# Patient Record
Sex: Male | Born: 1995 | Race: Black or African American | Hispanic: No | Marital: Single | Smoking: Never smoker
Health system: Southern US, Community
[De-identification: ages and names within clinical notes are randomized; demographics above are authoritative.]

## PROBLEM LIST (undated history)

## (undated) DIAGNOSIS — T7840XA Allergy, unspecified, initial encounter: Secondary | ICD-10-CM

## (undated) DIAGNOSIS — D573 Sickle-cell trait: Secondary | ICD-10-CM

## (undated) DIAGNOSIS — L309 Dermatitis, unspecified: Secondary | ICD-10-CM

## (undated) DIAGNOSIS — G4733 Obstructive sleep apnea (adult) (pediatric): Secondary | ICD-10-CM

## (undated) DIAGNOSIS — E669 Obesity, unspecified: Secondary | ICD-10-CM

## (undated) DIAGNOSIS — J45909 Unspecified asthma, uncomplicated: Secondary | ICD-10-CM

## (undated) HISTORY — DX: Dermatitis, unspecified: L30.9

## (undated) HISTORY — DX: Sickle-cell trait: D57.3

## (undated) HISTORY — DX: Allergy, unspecified, initial encounter: T78.40XA

## (undated) HISTORY — DX: Obesity, unspecified: E66.9

## (undated) HISTORY — PX: TONSILLECTOMY: SUR1361

## (undated) HISTORY — DX: Obstructive sleep apnea (adult) (pediatric): G47.33

## (undated) HISTORY — DX: Unspecified asthma, uncomplicated: J45.909

---
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
## 2016-01-28 ENCOUNTER — Other Ambulatory Visit: Payer: Self-pay | Admitting: Pediatrics

## 2016-05-26 ENCOUNTER — Ambulatory Visit (INDEPENDENT_AMBULATORY_CARE_PROVIDER_SITE_OTHER): Payer: 59 | Admitting: Family

## 2016-05-26 ENCOUNTER — Encounter: Payer: Self-pay | Admitting: Family

## 2016-05-26 VITALS — BP 128/84 | Ht 68.5 in | Wt 207.9 lb

## 2016-05-26 DIAGNOSIS — Z23 Encounter for immunization: Secondary | ICD-10-CM | POA: Diagnosis not present

## 2016-05-26 DIAGNOSIS — Z0000 Encounter for general adult medical examination without abnormal findings: Secondary | ICD-10-CM | POA: Diagnosis not present

## 2016-05-26 DIAGNOSIS — Z68.41 Body mass index (BMI) pediatric, greater than or equal to 95th percentile for age: Secondary | ICD-10-CM

## 2016-05-26 DIAGNOSIS — E669 Obesity, unspecified: Secondary | ICD-10-CM

## 2016-05-26 DIAGNOSIS — Z00129 Encounter for routine child health examination without abnormal findings: Secondary | ICD-10-CM

## 2016-05-26 NOTE — Progress Notes (Signed)
Subjective:     History was provided by the patient.  Craig Baker is a 20 y.o. male who is here for this well-child visit.  Immunization History  Administered Date(s) Administered  . DTaP 01/01/1998, 03/08/1998, 05/10/1998, 01/31/1999, 08/24/2002  . HPV 9-valent 08/16/2014  . HPV Quadrivalent 06/11/2011, 05/11/2013  . Hepatitis A 11/01/1999, 06/03/2000  . Hepatitis B [birth year], 1997, 01/01/1998, 08/13/1998  . HiB (PRP-OMP) 01/01/1998, 03/08/1998, 01/31/1999  . IPV 01/01/1998, 03/08/1998, 10/31/1998, 08/24/2002  . Influenza Split 11/22/2007, 09/01/2009, 08/22/2010, 09/12/2011  . Influenza,inj,quad, With Preservative 08/16/2014  . Influenza-Unspecified 08/31/2013  . MMR 10/31/1998, 09/19/2002  . Meningococcal Conjugate 06/11/2011  . Pneumococcal Conjugate-13 05/02/1999, 07/29/1999  . Rotavirus Pentavalent 01/02/1998, 03/08/1998, 05/10/1998  . Tdap 11/21/2008  . Varicella 10/31/1998, 05/11/2013   The following portions of the patient's history were reviewed and updated as appropriate: allergies, current medications, past family history, past medical history, past social history, past surgical history and problem list.  Current Issues: Current concerns include NO concerns. Ready to go to college. Currently menstruating? not applicable Sexually active? no  Does patient snore? no   Review of Nutrition: Current diet: Eats an ok diet. Has been eating out lately. Drinks mainly water and juice.  Balanced diet? yes  Social Screening:  Parental relations: Gets along with parents well  Sibling relations: only child Discipline concerns? no Concerns regarding behavior with peers? no School performance: doing well; no concerns Secondhand smoke exposure? no  Screening Questions: Risk factors for anemia: no Risk factors for vision problems: no Risk factors for hearing problems: no Risk factors for tuberculosis: no Risk factors for dyslipidemia: no Risk factors for sexually-transmitted  infections: no Risk factors for alcohol/drug use:  no    Objective:    There were no vitals filed for this visit. Growth parameters are noted and are not appropriate for age.  General:   alert and cooperative  Gait:   normal  Skin:   normal  Oral cavity:   lips, mucosa, and tongue normal; teeth and gums normal  Eyes:   sclerae white, pupils equal and reactive, red reflex normal bilaterally  Ears:   normal bilaterally  Neck:   no adenopathy, no carotid bruit, no JVD, supple, symmetrical, trachea midline and thyroid not enlarged, symmetric, no tenderness/mass/nodules  Lungs:  clear to auscultation bilaterally and normal percussion bilaterally  Heart:   regular rate and rhythm, S1, S2 normal, no murmur, click, rub or gallop  Abdomen:  soft, non-tender; bowel sounds normal; no masses,  no organomegaly  GU:  normal genitalia, normal testes and scrotum, no hernias present  Tanner Stage:   5  Extremities:   extremities normal, atraumatic, no cyanosis or edema  Neuro:  normal without focal findings, mental status, speech normal, alert and oriented x3, PERLA and reflexes normal and symmetric     Assessment:    Well adolescent.    Plan:    1. Anticipatory guidance discussed. Gave handout on well-child issues at this age. Specific topics reviewed: bicycle helmets, drugs, ETOH, and tobacco, importance of regular dental care, importance of regular exercise, importance of varied diet, limit TV, media violence, minimize junk food, puberty, safe storage of any firearms in the home, seat belts, sex; STD and pregnancy prevention and testicular self-exam.  2.  Weight management:  The patient was counseled regarding nutrition and physical activity.  3. Development: appropriate for age  59. Immunizations today: per orders. History of previous adverse reactions to immunizations? no  5. Follow-up visit in 1 year  for next well child visit, or sooner as needed.   

## 2016-05-26 NOTE — Patient Instructions (Signed)
Well Child Care - 20 Years Old SCHOOL PERFORMANCE  Your teenager should begin preparing for college or technical school. To keep your teenager on track, help him or her:   Prepare for college admissions exams and meet exam deadlines.   Fill out college or technical school applications and meet application deadlines.   Schedule time to study. Teenagers with part-time jobs may have difficulty balancing a job and schoolwork. SOCIAL AND EMOTIONAL DEVELOPMENT  Your teenager:  May seek privacy and spend less time with family.  May seem overly focused on himself or herself (self-centered).  May experience increased sadness or loneliness.  May also start worrying about his or her future.  Will want to make his or her own decisions (such as about friends, studying, or extracurricular activities).  Will likely complain if you are too involved or interfere with his or her plans.  Will develop more intimate relationships with friends. ENCOURAGING DEVELOPMENT  Encourage your teenager to:   Participate in sports or after-school activities.   Develop his or her interests.   Volunteer or join a Systems developer.  Help your teenager develop strategies to deal with and manage stress.  Encourage your teenager to participate in approximately 60 minutes of daily physical activity.   Limit television and computer time to 2 hours each day. Teenagers who watch excessive television are more likely to become overweight. Monitor television choices. Block channels that are not acceptable for viewing by teenagers. RECOMMENDED IMMUNIZATIONS  Hepatitis B vaccine. Doses of this vaccine may be obtained, if needed, to catch up on missed doses. A child or teenager aged 11-15 years can obtain a 2-dose series. The second dose in a 2-dose series should be obtained no earlier than 4 months after the first dose.  Tetanus and diphtheria toxoids and acellular pertussis (Tdap) vaccine. A child or  teenager aged 11-18 years who is not fully immunized with the diphtheria and tetanus toxoids and acellular pertussis (DTaP) or has not obtained a dose of Tdap should obtain a dose of Tdap vaccine. The dose should be obtained regardless of the length of time since the last dose of tetanus and diphtheria toxoid-containing vaccine was obtained. The Tdap dose should be followed with a tetanus diphtheria (Td) vaccine dose every 10 years. Pregnant adolescents should obtain 1 dose during each pregnancy. The dose should be obtained regardless of the length of time since the last dose was obtained. Immunization is preferred in the 27th to 36th week of gestation.  Pneumococcal conjugate (PCV13) vaccine. Teenagers who have certain conditions should obtain the vaccine as recommended.  Pneumococcal polysaccharide (PPSV23) vaccine. Teenagers who have certain high-risk conditions should obtain the vaccine as recommended.  Inactivated poliovirus vaccine. Doses of this vaccine may be obtained, if needed, to catch up on missed doses.  Influenza vaccine. A dose should be obtained every year.  Measles, mumps, and rubella (MMR) vaccine. Doses should be obtained, if needed, to catch up on missed doses.  Varicella vaccine. Doses should be obtained, if needed, to catch up on missed doses.  Hepatitis A vaccine. A teenager who has not obtained the vaccine before 19 years of age should obtain the vaccine if he or she is at risk for infection or if hepatitis A protection is desired.  Human papillomavirus (HPV) vaccine. Doses of this vaccine may be obtained, if needed, to catch up on missed doses.  Meningococcal vaccine. A booster should be obtained at age 15 years. Doses should be obtained, if needed, to catch  up on missed doses. Children and adolescents aged 11-18 years who have certain high-risk conditions should obtain 2 doses. Those doses should be obtained at least 8 weeks apart. TESTING Your teenager should be screened  for:   Vision and hearing problems.   Alcohol and drug use.   High blood pressure.  Scoliosis.  HIV. Teenagers who are at an increased risk for hepatitis B should be screened for this virus. Your teenager is considered at high risk for hepatitis B if:  You were born in a country where hepatitis B occurs often. Talk with your health care provider about which countries are considered high-risk.  Your were born in a high-risk country and your teenager has not received hepatitis B vaccine.  Your teenager has HIV or AIDS.  Your teenager uses needles to inject street drugs.  Your teenager lives with, or has sex with, someone who has hepatitis B.  Your teenager is a male and has sex with other males (MSM).  Your teenager gets hemodialysis treatment.  Your teenager takes certain medicines for conditions like cancer, organ transplantation, and autoimmune conditions. Depending upon risk factors, your teenager may also be screened for:   Anemia.   Tuberculosis.  Depression.  Cervical cancer. Most females should wait until they turn 19 years old to have their first Pap test. Some adolescent girls have medical problems that increase the chance of getting cervical cancer. In these cases, the health care provider may recommend earlier cervical cancer screening. If your child or teenager is sexually active, he or she may be screened for:  Certain sexually transmitted diseases.  Chlamydia.  Gonorrhea (females only).  Syphilis.  Pregnancy. If your child is male, her health care provider may ask:  Whether she has begun menstruating.  The start date of her last menstrual cycle.  The typical length of her menstrual cycle. Your teenager's health care provider will measure body mass index (BMI) annually to screen for obesity. Your teenager should have his or her blood pressure checked at least one time per year during a well-child checkup. The health care provider may interview  your teenager without parents present for at least part of the examination. This can insure greater honesty when the health care provider screens for sexual behavior, substance use, risky behaviors, and depression. If any of these areas are concerning, more formal diagnostic tests may be done. NUTRITION  Encourage your teenager to help with meal planning and preparation.   Model healthy food choices and limit fast food choices and eating out at restaurants.   Eat meals together as a family whenever possible. Encourage conversation at mealtime.   Discourage your teenager from skipping meals, especially breakfast.   Your teenager should:   Eat a variety of vegetables, fruits, and lean meats.   Have 3 servings of low-fat milk and dairy products daily. Adequate calcium intake is important in teenagers. If your teenager does not drink milk or consume dairy products, he or she should eat other foods that contain calcium. Alternate sources of calcium include dark and leafy greens, canned fish, and calcium-enriched juices, breads, and cereals.   Drink plenty of water. Fruit juice should be limited to 8-12 oz (240-360 mL) each day. Sugary beverages and sodas should be avoided.   Avoid foods high in fat, salt, and sugar, such as candy, chips, and cookies.  Body image and eating problems may develop at this age. Monitor your teenager closely for any signs of these issues and contact your health care  provider if you have any concerns. ORAL HEALTH Your teenager should brush his or her teeth twice a day and floss daily. Dental examinations should be scheduled twice a year.  SKIN CARE  Your teenager should protect himself or herself from sun exposure. He or she should wear weather-appropriate clothing, hats, and other coverings when outdoors. Make sure that your child or teenager wears sunscreen that protects against both UVA and UVB radiation.  Your teenager may have acne. If this is  concerning, contact your health care provider. SLEEP Your teenager should get 8.5-9.5 hours of sleep. Teenagers often stay up late and have trouble getting up in the morning. A consistent lack of sleep can cause a number of problems, including difficulty concentrating in class and staying alert while driving. To make sure your teenager gets enough sleep, he or she should:   Avoid watching television at bedtime.   Practice relaxing nighttime habits, such as reading before bedtime.   Avoid caffeine before bedtime.   Avoid exercising within 3 hours of bedtime. However, exercising earlier in the evening can help your teenager sleep well.  PARENTING TIPS Your teenager may depend more upon peers than on you for information and support. As a result, it is important to stay involved in your teenager's life and to encourage him or her to make healthy and safe decisions.   Be consistent and fair in discipline, providing clear boundaries and limits with clear consequences.  Discuss curfew with your teenager.   Make sure you know your teenager's friends and what activities they engage in.  Monitor your teenager's school progress, activities, and social life. Investigate any significant changes.  Talk to your teenager if he or she is moody, depressed, anxious, or has problems paying attention. Teenagers are at risk for developing a mental illness such as depression or anxiety. Be especially mindful of any changes that appear out of character.  Talk to your teenager about:  Body image. Teenagers may be concerned with being overweight and develop eating disorders. Monitor your teenager for weight gain or loss.  Handling conflict without physical violence.  Dating and sexuality. Your teenager should not put himself or herself in a situation that makes him or her uncomfortable. Your teenager should tell his or her partner if he or she does not want to engage in sexual activity. SAFETY    Encourage your teenager not to blast music through headphones. Suggest he or she wear earplugs at concerts or when mowing the lawn. Loud music and noises can cause hearing loss.   Teach your teenager not to swim without adult supervision and not to dive in shallow water. Enroll your teenager in swimming lessons if your teenager has not learned to swim.   Encourage your teenager to always wear a properly fitted helmet when riding a bicycle, skating, or skateboarding. Set an example by wearing helmets and proper safety equipment.   Talk to your teenager about whether he or she feels safe at school. Monitor gang activity in your neighborhood and local schools.   Encourage abstinence from sexual activity. Talk to your teenager about sex, contraception, and sexually transmitted diseases.   Discuss cell phone safety. Discuss texting, texting while driving, and sexting.   Discuss Internet safety. Remind your teenager not to disclose information to strangers over the Internet. Home environment:  Equip your home with smoke detectors and change the batteries regularly. Discuss home fire escape plans with your teen.  Do not keep handguns in the home. If there  is a handgun in the home, the gun and ammunition should be locked separately. Your teenager should not know the lock combination or where the key is kept. Recognize that teenagers may imitate violence with guns seen on television or in movies. Teenagers do not always understand the consequences of their behaviors. Tobacco, alcohol, and drugs:  Talk to your teenager about smoking, drinking, and drug use among friends or at friends' homes.   Make sure your teenager knows that tobacco, alcohol, and drugs may affect brain development and have other health consequences. Also consider discussing the use of performance-enhancing drugs and their side effects.   Encourage your teenager to call you if he or she is drinking or using drugs, or if  with friends who are.   Tell your teenager never to get in a car or boat when the driver is under the influence of alcohol or drugs. Talk to your teenager about the consequences of drunk or drug-affected driving.   Consider locking alcohol and medicines where your teenager cannot get them. Driving:  Set limits and establish rules for driving and for riding with friends.   Remind your teenager to wear a seat belt in cars and a life vest in boats at all times.   Tell your teenager never to ride in the bed or cargo area of a pickup truck.   Discourage your teenager from using all-terrain or motorized vehicles if younger than 16 years. WHAT'S NEXT? Your teenager should visit a pediatrician yearly.    This information is not intended to replace advice given to you by your health care provider. Make sure you discuss any questions you have with your health care provider.   Document Released: 01/29/2007 Document Revised: 11/24/2014 Document Reviewed: 07/19/2013 Elsevier Interactive Patient Education [employer].

## 2017-03-04 ENCOUNTER — Other Ambulatory Visit: Payer: Self-pay | Admitting: Pediatrics

## 2017-07-13 ENCOUNTER — Ambulatory Visit (INDEPENDENT_AMBULATORY_CARE_PROVIDER_SITE_OTHER): Payer: PRIVATE HEALTH INSURANCE | Admitting: Pediatrics

## 2017-07-13 VITALS — Wt 224.0 lb

## 2017-07-13 DIAGNOSIS — J4520 Mild intermittent asthma, uncomplicated: Secondary | ICD-10-CM | POA: Diagnosis not present

## 2017-07-13 DIAGNOSIS — J069 Acute upper respiratory infection, unspecified: Secondary | ICD-10-CM

## 2017-07-13 DIAGNOSIS — Z23 Encounter for immunization: Secondary | ICD-10-CM | POA: Diagnosis not present

## 2017-07-13 MED ORDER — ALBUTEROL SULFATE HFA 108 (90 BASE) MCG/ACT IN AERS: 2.0000 | INHALATION_SPRAY | Freq: Four times a day (QID) | RESPIRATORY_TRACT | 6 refills | Status: DC | PRN

## 2017-07-13 MED ORDER — ALBUTEROL SULFATE (2.5 MG/3ML) 0.083% IN NEBU: 2.5000 mg | INHALATION_SOLUTION | Freq: Four times a day (QID) | RESPIRATORY_TRACT | 0 refills | Status: DC | PRN

## 2017-07-13 NOTE — Patient Instructions (Signed)
Bronchospasm, Adult Bronchospasm is when airways in the lungs get smaller. When this happens, it can be hard to breathe. You may cough. You may also make a whistling sound when you breathe (wheeze). Follow these instructions at home: Medicines  Take over-the-counter and prescription medicines only as told by your doctor.  If you need to use an inhaler or nebulizer to take your medicine, ask your doctor how to use it.  If you were given a spacer, always use it with your inhaler. Lifestyle  Change your heating and air conditioning filter. Do this at least once a month.  Try not to use fireplaces and wood stoves.  Do not  smoke. Do not  allow smoking in your home.  Try not to use things that have a strong smell, like perfume.  Get rid of pests (such as roaches and mice) and their poop.  Remove any mold from your home.  Keep your house clean. Get rid of dust.  Use cleaning products that have no smell.  Replace carpet with wood, tile, or vinyl flooring.  Use allergy-proof pillows, mattress covers, and box spring covers.  Wash bed sheets and blankets every week. Use hot water. Dry them in a dryer.  Use blankets that are made of polyester or cotton.  Wash your hands often.  Keep pets out of your bedroom.  When you exercise, try not to breathe in cold air. General instructions  Have a plan for getting medical care. Know these things: ? When to call your doctor. ? When to call local emergency services (911 in the [place] ? Where to go in an emergency.  Stay up to date on your shots (immunizations).  When you have an episode: ? Stay calm. ? Relax. ? Breathe slowly. Contact a doctor if:  Your muscles ache.  Your chest hurts.  The color of the mucus you cough up (sputum) changes from clear or white to yellow, green, gray, or bloody.  The mucus you cough up gets thicker.  You have a fever. Get help right away if:  The whistling sound gets worse, even after you  take your medicines.  Your coughing gets worse.  You find it even harder to breathe.  Your chest hurts very much. Summary  Bronchospasm is when airways in the lungs get smaller.  When this happens, it can be hard to breathe. You may cough. You may also make a whistling sound when you breathe.  Stay away from things that cause you to have episodes. These include smoke or dust. This information is not intended to replace advice given to you by your health care provider. Make sure you discuss any questions you have with your health care provider. Document Released: 08/31/2009 Document Revised: 11/06/2016 Document Reviewed: 11/06/2016 Elsevier Interactive Patient Education  2017 Elsevier Inc.  

## 2017-07-13 NOTE — Progress Notes (Signed)
Subjective:    Craig Baker is a 21 y.o. old male here with his father for Asthma (refill albuterol) .    HPI: Craig Baker presents with history of runny nose, congestion, cough.  This started 3 days ago and did have tightness in chest.  He took albuterol and breathing was much easier.  It has been 2 years since he needed albuterol.  He usually doesn't need it but occasionally if he has a cold.  Triggers are viral illness.   He does report some tighness when he exercises in past.  Not currently having any breathing issues but does need refills.  Denies any fevers, wheezing, abd pain, lethargy.    The following portions of the patient's history were reviewed and updated as appropriate: allergies, current medications, past family history, past medical history, past social history, past surgical history and problem list.  Review of Systems Pertinent items are noted in HPI.   Allergies: Allergies  Allergen Reactions  . Fish Allergy Shortness Of Breath and Itching     Current Outpatient Prescriptions on File Prior to Visit  Medication Sig Dispense Refill  . cetirizine (ZYRTEC) 10 MG tablet take 1 tablet by mouth once daily 30 tablet 12  . fluticasone (FLONASE) 50 MCG/ACT nasal spray instill 2 sprays into each nostril once daily 16 g 12  . loratadine (CLARITIN) 10 MG tablet Take 1 tablet (10 mg total) by mouth daily. 30 tablet 12  . montelukast (SINGULAIR) 10 MG tablet take 1 tablet by mouth once daily 30 tablet 12   No current facility-administered medications on file prior to visit.     History and Problem List: Past Medical History:  Diagnosis Date  . Allergy   . Asthma    much improved.  "Rarely" has wheeze"  . Eczema   . Obesity    BMI 95-99%  . OSA (obstructive sleep apnea)   . Sickle cell trait [facility])     Patient Active Problem List   Diagnosis Date Noted  . Viral upper respiratory tract infection 07/15/2017  . Body mass index, pediatric, 85th percentile to less than 95th percentile  for age 66/30/2015  . Wheezing-associated respiratory infection (WARI) 08/04/2014  . Wheeze 08/04/2014  . 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 Elevated BP 10/26/2013  . 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 Well child check 05/11/2013  . School failure 05/11/2013  . Allergic rhinitis 10/01/2011  . OSA (obstructive sleep apnea) 10/01/2011  . Eczema   . Asthma 06/11/2011  . BMI (body mass index), pediatric, 95-99% for age 35/25/2012        Objective:    Wt 224 lb (101.6 kg)   SpO2 98%   BMI 33.56 kg/m   General: alert, active, cooperative, non toxic ENT: oropharynx moist, no lesions, nares clear discharge, nasal congestion Eye:  PERRL, EOMI, conjunctivae clear, no discharge Ears: TM clear/intact bilateral, no discharge Neck: supple, no sig LAD Lungs: clear to auscultation, no wheeze, crackles or retractions Heart: RRR, Nl S1, S2, no murmurs Abd: soft, non tender, non distended, normal BS, no organomegaly, no masses appreciated Skin: no rashes Neuro: normal mental status, No focal deficits  No results found for this or any previous visit (from the past 72 hour(s)).     Assessment:   Craig Baker is a 21 y.o. old male with  1. Viral upper respiratory tract infection   2. Mild intermittent asthma without complication   3. Need for prophylactic vaccination and inoculation against influenza  Plan:   1.  1.  Discussed suportive care for runny nose and congestion  And cough with OTC.  Monitor for retractions, tachypnea, fevers or worsening symptoms.  Consider some exercise induced asthma as he does report some tightness when he is active.  Recommend pretreating prior to exercise.  Spacer provided to use with inhaler.  Refilled albuterol.   Viral colds can last 7-10 days, smoke exposure can exacerbate and lengthen symptoms.  No steroids currently needed but return if no improvement.   --flu shot given today.   2.  Discussed to return for worsening  symptoms or further concerns.    Patient's Medications  New Prescriptions   ALBUTEROL (PROVENTIL HFA;VENTOLIN HFA) 108 (90 BASE) MCG/ACT INHALER    Inhale 2 puffs into the lungs every 6 (six) hours as needed for wheezing or shortness of breath.   ALBUTEROL (PROVENTIL) (2.5 MG/3ML) 0.083% NEBULIZER SOLUTION    Take 3 mLs (2.5 mg total) by nebulization every 6 (six) hours as needed for wheezing or shortness of breath.  Previous Medications   CETIRIZINE (ZYRTEC) 10 MG TABLET    take 1 tablet by mouth once daily   FLUTICASONE (FLONASE) 50 MCG/ACT NASAL SPRAY    instill 2 sprays into each nostril once daily   LORATADINE (CLARITIN) 10 MG TABLET    Take 1 tablet (10 mg total) by mouth daily.   MONTELUKAST (SINGULAIR) 10 MG TABLET    take 1 tablet by mouth once daily  Modified Medications   No medications on file  Discontinued Medications   ALBUTEROL (PROVENTIL HFA;VENTOLIN HFA) 108 (90 BASE) MCG/ACT INHALER    Inhale 2 puffs into the lungs every 4 (four) hours as needed for wheezing or shortness of breath (cough, shortness of breath or wheezing.).   ALBUTEROL (PROVENTIL) (2.5 MG/3ML) 0.083% NEBULIZER SOLUTION    inhale contents of 1 vial in nebulizer every 2 hours if needed for wheezing or shortness of breath     Return if symptoms worsen or fail to improve. in 2-3 days  Myles Gip, DO

## 2017-07-15 ENCOUNTER — Encounter: Payer: Self-pay | Admitting: Pediatrics

## 2017-07-15 DIAGNOSIS — J069 Acute upper respiratory infection, unspecified: Secondary | ICD-10-CM | POA: Insufficient documentation

## 2018-09-29 ENCOUNTER — Encounter (HOSPITAL_COMMUNITY): Payer: Self-pay | Admitting: Family Medicine

## 2018-09-29 ENCOUNTER — Other Ambulatory Visit: Payer: Self-pay

## 2018-09-29 ENCOUNTER — Ambulatory Visit (HOSPITAL_COMMUNITY)
Admission: EM | Admit: 2018-09-29 | Discharge: 2018-09-29 | Disposition: A | Discharge status: Home / Self Care | Payer: BC Managed Care – PPO | Attending: Family Medicine | Admitting: Family Medicine

## 2018-09-29 DIAGNOSIS — K529 Noninfective gastroenteritis and colitis, unspecified: Secondary | ICD-10-CM

## 2018-09-29 MED ORDER — ONDANSETRON 4 MG PO TBDP
4.0000 mg | ORAL_TABLET | Freq: Once | ORAL | Status: AC
  Administered 2018-09-29: 4 mg via ORAL

## 2018-09-29 MED ORDER — ONDANSETRON 4 MG PO TBDP
ORAL_TABLET | ORAL | Status: AC
  Filled 2018-09-29: qty 1

## 2018-09-29 MED ORDER — ONDANSETRON 4 MG PO TBDP: 4.0000 mg | ORAL_TABLET | Freq: Once | ORAL | Status: DC

## 2018-09-29 MED ORDER — ONDANSETRON 4 MG PO TBDP: 4.0000 mg | ORAL_TABLET | Freq: Three times a day (TID) | ORAL | 0 refills | Status: DC | PRN

## 2018-09-29 NOTE — Discharge Instructions (Addendum)

## 2018-09-29 NOTE — ED Provider Notes (Signed)
[facility]   [id number] 09/29/18 Arrival Time: 1600  ASSESSMENT & PLAN:  1. Gastroenteritis   Benign abdominal exam. Suspect viral etiology. No sign of acute dehydration requiring hospital evaluation or admission.  Meds ordered this encounter  Medications  . ondansetron (ZOFRAN-ODT) disintegrating tablet 4 mg  . ondansetron (ZOFRAN-ODT) 4 MG disintegrating tablet    Sig: Take 1 tablet (4 mg total) by mouth every 8 (eight) hours as needed for nausea or vomiting.    Dispense:  15 tablet    Refill:  0   Discussed typical duration of symptoms for suspected viral GI illness. Will do his best to ensure adequate fluid intake in order to avoid dehydration. Will proceed to the Emergency Department for evaluation if unable to tolerate PO fluids regularly.  Otherwise he will f/u with his PCP or here if not showing improvement over the next 48-72 hours.  Reviewed expectations re: course of current medical issues. Questions answered. Outlined signs and symptoms indicating need for more acute intervention. Patient verbalized understanding. After Visit Summary given.   SUBJECTIVE: History from: patient.  Craig Baker is a 22 y.o. male who presents with complaint of non-bloody intermittent nausea and vomiting of brown material with diarrhea. Onset today. Abdominal discomfort: mild and cramping; epigastric without radiation. Symptoms are unchanged since beginning. Aggravating factors: eating. Alleviating factors: none. Associated symptoms: fatigue. He denies arthralgias and sweats. Questions subjective fever. Appetite: decreased. PO intake: decreased. Ambulatory without assistance. Urinary symptoms: none. Last bowel movement today without blood. Sick contacts: none. Recent travel or camping: none. OTC treatment: none.  Past Surgical History:  Procedure Laterality Date  . ADENOIDECTOMY  2004  . TONSILLECTOMY     ROS: As per HPI. All other systems negative.    OBJECTIVE:  Vitals:   09/29/18 1624 09/29/18 1625  BP: 140/81   Pulse: 82   Resp: 18   Temp: 98.1 F (36.7 C)   TempSrc: Oral   SpO2: 100%   Weight:  90.7 kg    General appearance: alert; no distress but appears very fatigued Oropharynx: moist Lungs: clear to auscultation bilaterally; unlabored Heart: regular rate and rhythm Abdomen: soft; non-distended; no significant abdominal tenderness; reports "cramping" feeling; bowel sounds present; no masses or organomegaly; no guarding or rebound tenderness Back: no CVA tenderness Extremities: no edema; symmetrical with no gross deformities Skin: warm; dry Neurologic: normal gait Psychological: alert and cooperative; normal mood and affect   Allergies  Allergen Reactions  . Fish Allergy Shortness Of Breath and Itching                                               Past Medical History:  Diagnosis Date  . Allergy   . Asthma    much improved.  "Rarely" has wheeze"  . Eczema   . Obesity    BMI 95-99%  . OSA (obstructive sleep apnea)   . Sickle cell trait (HCC)    Social History   Socioeconomic History  . Marital status: Single    Spouse name: Not on file  . Number of children: 0  . Years of education: 10  . Highest education level: Not on file  Occupational History  . Occupation: school  Social Needs  . Financial resource strain: Not on file  . Food insecurity:    Worry: Not on file    Inability: Not  on file  . Transportation needs:    Medical: Not on file    Non-medical: Not on file  Tobacco Use  . Smoking status: Never Smoker  . Smokeless tobacco: Never Used  Substance and Sexual Activity  . Alcohol use: No  . Drug use: No  . Sexual activity: Never  Lifestyle  . Physical activity:    Days per week: Not on file    Minutes per session: Not on file  . Stress: Not on file  Relationships  . Social connections:    Talks on phone: Not on file    Gets together: Not on file    Attends religious service: Not  on file    Active member of club or organization: Not on file    Attends meetings of clubs or organizations: Not on file    Relationship status: Not on file  . Intimate partner violence:    Fear of current or ex partner: Not on file    Emotionally abused: Not on file    Physically abused: Not on file    Forced sexual activity: Not on file  Other Topics Concern  . Not on file  Social History Narrative   Lives at home with mom and dad. Only child    - Going to college at [employer]. 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    - Plays video games mainly.    Family History  Problem Relation Age of Onset  . Asthma Mother   . Asthma Maternal Grandmother   . Diabetes Paternal Grandmother   . Hypertension Paternal Grandmother   . Alcohol abuse Neg Hx   . Arthritis Neg Hx   . Birth defects Neg Hx   . Cancer Neg Hx   . COPD Neg Hx   . Depression Neg Hx   . Drug abuse Neg Hx   . Early death Neg Hx   . Hearing loss Neg Hx   . Heart disease Neg Hx   . Hyperlipidemia Neg Hx   . Kidney disease Neg Hx   . Learning disabilities Neg Hx   . Mental illness Neg Hx   . Miscarriages / Stillbirths Neg Hx   . Mental retardation Neg Hx   . Stroke Neg Hx   . Vision loss Neg Hx      Mardella Layman, MD 09/29/18 1659

## 2018-09-29 NOTE — ED Triage Notes (Signed)
Pt cc diarrhea and vomiting  This started today.

## 2018-10-15 ENCOUNTER — Ambulatory Visit: Payer: BC Managed Care – PPO | Admitting: Emergency Medicine

## 2018-10-15 ENCOUNTER — Other Ambulatory Visit: Payer: Self-pay

## 2018-10-15 ENCOUNTER — Encounter: Payer: Self-pay | Admitting: Emergency Medicine

## 2018-10-15 VITALS — BP 127/84 | HR 71 | Temp 98.2°F | Resp 16 | Ht 69.5 in | Wt 241.4 lb

## 2018-10-15 DIAGNOSIS — Z7189 Other specified counseling: Secondary | ICD-10-CM

## 2018-10-15 DIAGNOSIS — Z7689 Persons encountering health services in other specified circumstances: Secondary | ICD-10-CM

## 2018-10-15 DIAGNOSIS — Z23 Encounter for immunization: Secondary | ICD-10-CM | POA: Diagnosis not present

## 2018-10-15 DIAGNOSIS — Z0000 Encounter for general adult medical examination without abnormal findings: Secondary | ICD-10-CM

## 2018-10-15 NOTE — Progress Notes (Signed)
Craig Baker 21 y.o.   Chief Complaint  Patient presents with  . Establish Care    not having any issus at this time    HISTORY OF PRESENT ILLNESS: This is a 22 y.o. male here to establish care today with me.  Was seen at the urgent care center on 09/29/2018 with a viral gastroenteritis and was referred to this office.  He is doing well and has no complaints or medical concerns.  Healthy male with no chronic medical problems on no chronic medications.  Non-smoker, college student, healthy lifestyle.  HPI   Prior to Admission medications   Medication Sig Start Date End Date Taking? Authorizing Provider  albuterol (PROVENTIL HFA;VENTOLIN HFA) 108 (90 Base) MCG/ACT inhaler Inhale 2 puffs into the lungs every 6 (six) hours as needed for wheezing or shortness of breath. 07/13/17   Myles GipAgbuya, Perry Scott, DO  albuterol (PROVENTIL) (2.5 MG/3ML) 0.083% nebulizer solution Take 3 mLs (2.5 mg total) by nebulization every 6 (six) hours as needed for wheezing or shortness of breath. 07/13/17   Myles GipAgbuya, Perry Scott, DO  loratadine (CLARITIN) 10 MG tablet Take 1 tablet (10 mg total) by mouth daily. 10/26/14   Preston FleetingHooker, James B, MD    Allergies  Allergen Reactions  . Fish Allergy Shortness Of Breath and Itching    Patient Active Problem List   Diagnosis Date Noted  . Viral upper respiratory tract infection 07/15/2017  . Body mass index, pediatric, 85th percentile to less than 95th percentile for age 70/30/2015  . Wheezing-associated respiratory infection (WARI) 08/04/2014  . Wheeze 08/04/2014  . 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 Elevated BP 10/26/2013  . 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 Well child check 05/11/2013  . School failure 05/11/2013  . Allergic rhinitis 10/01/2011  . OSA (obstructive sleep apnea) 10/01/2011  . Eczema   . Asthma 06/11/2011  . BMI (body mass index), pediatric, 95-99% for age 36/25/2012    Past Medical History:  Diagnosis Date  .  Allergy   . Asthma    much improved.  "Rarely" has wheeze"  . Eczema   . Obesity    BMI 95-99%  . OSA (obstructive sleep apnea)   . Sickle cell trait [facility](HCC)     Past Surgical History:  Procedure Laterality Date  . ADENOIDECTOMY  2004  . TONSILLECTOMY      Social History   Socioeconomic History  . Marital status: Single    Spouse name: Not on file  . Number of children: 0  . Years of education: 10  . Highest education level: Not on file  Occupational History  . Occupation: school  Social Needs  . Financial resource strain: Not on file  . Food insecurity:    Worry: Not on file    Inability: Not on file  . Transportation needs:    Medical: Not on file    Non-medical: Not on file  Tobacco Use  . Smoking status: Never Smoker  . Smokeless tobacco: Never Used  Substance and Sexual Activity  . Alcohol use: No  . Drug use: No  . Sexual activity: Never  Lifestyle  . Physical activity:    Days per week: Not on file    Minutes per session: Not on file  . Stress: Not on file  Relationships  . Social connections:    Talks on phone: Not on file    Gets together: Not on file    Attends religious service: Not on file  Active member of club or organization: Not on file    Attends meetings of clubs or organizations: Not on file    Relationship status: Not on file  . Intimate partner violence:    Fear of current or ex partner: Not on file    Emotionally abused: Not on file    Physically abused: Not on file    Forced sexual activity: Not on file  Other Topics Concern  . Not on file  Social History Narrative   Lives at home with mom and dad. Only child    - Going to college at [employer]. 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    - Plays video games mainly.     Family History  Problem Relation Age of Onset  . Asthma Mother   . Asthma Maternal Grandmother   . Diabetes Paternal Grandmother   . Hypertension Paternal Grandmother   . Alcohol abuse Neg Hx   .  Arthritis Neg Hx   . Birth defects Neg Hx   . Cancer Neg Hx   . COPD Neg Hx   . Depression Neg Hx   . Drug abuse Neg Hx   . Early death Neg Hx   . Hearing loss Neg Hx   . Heart disease Neg Hx   . Hyperlipidemia Neg Hx   . Kidney disease Neg Hx   . Learning disabilities Neg Hx   . Mental illness Neg Hx   . Miscarriages / Stillbirths Neg Hx   . Mental retardation Neg Hx   . Stroke Neg Hx   . Vision loss Neg Hx      Review of Systems  Constitutional: Negative.  Negative for chills and fever.  Eyes: Negative for blurred vision and double vision.  Respiratory: Negative for shortness of breath.   Cardiovascular: Negative for chest pain.  Gastrointestinal: Negative for abdominal pain, diarrhea, nausea and vomiting.  Neurological: Negative for dizziness and headaches.  All other systems reviewed and are negative.   Vitals:   10/15/18 1130  BP: 127/84  Pulse: 71  Resp: 16  Temp: 98.2 F (36.8 C)  SpO2: 96%    Physical Exam  Constitutional: He is oriented to person, place, and time. He appears well-developed and well-nourished.  HENT:  Head: Normocephalic and atraumatic.  Mouth/Throat: Oropharynx is clear and moist.  Eyes: Pupils are equal, round, and reactive to light. EOM are normal.  Neck: Normal range of motion. Neck supple.  Cardiovascular: Normal rate and regular rhythm.  Pulmonary/Chest: Effort normal and breath sounds normal.  Abdominal: Soft. There is no tenderness.  Musculoskeletal: Normal range of motion.  Neurological: He is alert and oriented to person, place, and time.  Skin: Skin is warm and dry. Capillary refill takes less than 2 seconds.  Psychiatric: He has a normal mood and affect. His behavior is normal.  Vitals reviewed.    ASSESSMENT & PLAN: Craig Baker was seen today for establish care.  Diagnoses and all orders for this visit:  Encounter to establish care  Normal exam  Other orders -     Flu Vaccine QUAD 36+ mos IM    Patient  Instructions       If you have lab work done today you will be contacted with your lab results within the next 2 weeks.  If you have not heard from [place] then please contact us. The fastest way to get your results is to register for My Chart.   IF you received an x-ray today,  you will receive an invoice from [facility]. Please contact [facility] at [phone] with questions or concerns regarding your invoice.   IF you received labwork today, you will receive an invoice from [facility]. Please contact [facility] at [phone] with questions or concerns regarding your invoice.   Our billing staff will not be able to assist you with questions regarding bills from these companies.  You will be contacted with the lab results as soon as they are available. The fastest way to get your results is to activate your My Chart account. Instructions are located on the last page of this paperwork. If you have not heard from [place] regarding the results in 2 weeks, please contact this office.      Health Maintenance, Male A healthy lifestyle and preventive care is important for your health and wellness. Ask your health care provider about what schedule of regular examinations is right for you. What should I know about weight and diet? Eat a Healthy Diet  Eat plenty of vegetables, fruits, whole grains, low-fat dairy products, and lean protein.  Do not eat a lot of foods high in solid fats, added sugars, or salt.  Maintain a Healthy Weight Regular exercise can help you achieve or maintain a healthy weight. You should:  Do at least 150 minutes of exercise each week. The exercise should increase your heart rate and make you sweat (moderate-intensity exercise).  Do strength-training exercises at least twice a week.  Watch Your Levels of Cholesterol and Blood Lipids  Have your blood tested for lipids and cholesterol every 5 years starting at 21 years of age. If you are at high risk for  heart disease, you should start having your blood tested when you are 21 years old. You may need to have your cholesterol levels checked more often if: ? Your lipid or cholesterol levels are high. ? You are older than 21 years of age. ? You are at high risk for heart disease.  What should I know about cancer screening? Many types of cancers can be detected early and may often be prevented. Lung Cancer  You should be screened every year for lung cancer if: ? You are a current smoker who has smoked for at least 30 years. ? You are a former smoker who has quit within the past 15 years.  Talk to your health care provider about your screening options, when you should start screening, and how often you should be screened.  Colorectal Cancer  Routine colorectal cancer screening usually begins at 21 years of age and should be repeated every 5-10 years until you are 21 years old. You may need to be screened more often if early forms of precancerous polyps or small growths are found. Your health care provider may recommend screening at an earlier age if you have risk factors for colon cancer.  Your health care provider may recommend using home test kits to check for hidden blood in the stool.  A small camera at the end of a tube can be used to examine your colon (sigmoidoscopy or colonoscopy). This checks for the earliest forms of colorectal cancer.  Prostate and Testicular Cancer  Depending on your age and overall health, your health care provider may do certain tests to screen for prostate and testicular cancer.  Talk to your health care provider about any symptoms or concerns you have about testicular or prostate cancer.  Skin Cancer  Check your skin from head to toe regularly.  Tell your health  care provider about any new moles or changes in moles, especially if: ? There is a change in a mole's size, shape, or color. ? You have a mole that is larger than a pencil eraser.  Always use  sunscreen. Apply sunscreen liberally and repeat throughout the day.  Protect yourself by wearing long sleeves, pants, a wide-brimmed hat, and sunglasses when outside.  What should I know about heart disease, diabetes, and high blood pressure?  If you are 42-40 years of age, have your blood pressure checked every 3-5 years. If you are 45 years of age or older, have your blood pressure checked every year. You should have your blood pressure measured twice-once when you are at a hospital or clinic, and once when you are not at a hospital or clinic. Record the average of the two measurements. To check your blood pressure when you are not at a hospital or clinic, you can use: ? An automated blood pressure machine at a pharmacy. ? A home blood pressure monitor.  Talk to your health care provider about your target blood pressure.  If you are between 39-77 years old, ask your health care provider if you should take aspirin to prevent heart disease.  Have regular diabetes screenings by checking your fasting blood sugar level. ? If you are at a normal weight and have a low risk for diabetes, have this test once every three years after the age of 56. ? If you are overweight and have a high risk for diabetes, consider being tested at a younger age or more often.  A one-time screening for abdominal aortic aneurysm (AAA) by ultrasound is recommended for men aged 65-75 years who are current or former smokers. What should I know about preventing infection? Hepatitis B If you have a higher risk for hepatitis B, you should be screened for this virus. Talk with your health care provider to find out if you are at risk for hepatitis B infection. Hepatitis C Blood testing is recommended for:  Everyone born from [birth year] through 1964.  Anyone with known risk factors for hepatitis C.  Sexually Transmitted Diseases (STDs)  You should be screened each year for STDs including gonorrhea and chlamydia if: ? You are  sexually active and are younger than 21 years of age. ? You are older than 21 years of age and your health care provider tells you that you are at risk for this type of infection. ? Your sexual activity has changed since you were last screened and you are at an increased risk for chlamydia or gonorrhea. Ask your health care provider if you are at risk.  Talk with your health care provider about whether you are at high risk of being infected with HIV. Your health care provider may recommend a prescription medicine to help prevent HIV infection.  What else can I do?  Schedule regular health, dental, and eye exams.  Stay current with your vaccines (immunizations).  Do not use any tobacco products, such as cigarettes, chewing tobacco, and e-cigarettes. If you need help quitting, ask your health care provider.  Limit alcohol intake to no more than 2 drinks per day. One drink equals 12 ounces of beer, 5 ounces of wine, or 1 ounces of hard liquor.  Do not use street drugs.  Do not share needles.  Ask your health care provider for help if you need support or information about quitting drugs.  Tell your health care provider if you often feel depressed.  Tell your  health care provider if you have ever been abused or do not feel safe at home. This information is not intended to replace advice given to you by your health care provider. Make sure you discuss any questions you have with your health care provider. Document Released: 05/01/2008 Document Revised: 07/02/2016 Document Reviewed: 08/07/2015 Elsevier Interactive Patient Education  2018 Elsevier Inc.      Edwina Barth, MD [facility]

## 2018-10-15 NOTE — Patient Instructions (Addendum)

## 2019-01-03 ENCOUNTER — Other Ambulatory Visit: Payer: Self-pay | Admitting: Emergency Medicine

## 2019-01-03 NOTE — Telephone Encounter (Signed)
Copied from CRM [id number]. Topic: Quick Communication - Rx Refill/Question >> Jan 03, 2019  3:36 PM Neomia Dear, Efraim Kaufmann wrote: Medication: albuterol (PROVENTIL HFA;VENTOLIN HFA) 108 (90 Base) MCG/ACT inhaler, albuterol (PROVENTIL) (2.5 MG/3ML) 0.083% nebulizer solution  Has the patient contacted their pharmacy? Yes.   (Agent: If no, request that the patient contact the pharmacy for the refill.) (Agent: If yes, when and what did the pharmacy advise?) Call office  Preferred Pharmacy (with phone number or street name): [id number] [place], [place] - [phone] AT [facility] OF [address] & [address] [phone] (Phone) [phone] (Fax)    Agent: Please be advised that RX refills may take up to 3 business days. We ask that you follow-up with your pharmacy.

## 2019-01-03 NOTE — Telephone Encounter (Signed)
Requested medication (s) are due for refill today: yes to both  Requested medication (s) are on the active medication list: yes to both  Last refill:  Albuterol inhaler 07/13/17 #2 6 RF                  Albuterol nebulizer 07/13/17 75 ml  Future visit scheduled: no  Notes to clinic:  Pt just established care with Dr Alvy Bimler Pt prescription indicates 2 inhalers need to be dispensed 1 for home 1 for school   Requested Prescriptions  Pending Prescriptions Disp Refills   albuterol (PROVENTIL HFA;VENTOLIN HFA) 108 (90 Base) MCG/ACT inhaler 2 Inhaler 6    Sig: Inhale 2 puffs into the lungs every 6 (six) hours as needed for wheezing or shortness of breath.     Pulmonology:  Beta Agonists Failed - 01/03/2019  3:47 PM      Failed - One inhaler should last at least one month. If the patient is requesting refills earlier, contact the patient to check for uncontrolled symptoms.      Passed - Valid encounter within last 12 months    Recent Outpatient Visits          2 months ago Encounter to establish care   Primary Care at [facility], [place], [place]   4 years ago Hemoglobin C trait   [facility] at [facility], Levell July, MD            albuterol (PROVENTIL) (2.5 MG/3ML) 0.083% nebulizer solution 75 mL 0    Sig: Take 3 mLs (2.5 mg total) by nebulization every 6 (six) hours as needed for wheezing or shortness of breath.     Pulmonology:  Beta Agonists Failed - 01/03/2019  3:47 PM      Failed - One inhaler should last at least one month. If the patient is requesting refills earlier, contact the patient to check for uncontrolled symptoms.      Passed - Valid encounter within last 12 months    Recent Outpatient Visits          2 months ago Encounter to establish care   Primary Care at [facility], Eilleen Kempf, MD   4 years ago Hemoglobin C trait   [facility] at [facility], Levell July, MD

## 2019-01-04 MED ORDER — ALBUTEROL SULFATE HFA 108 (90 BASE) MCG/ACT IN AERS: 2.0000 | INHALATION_SPRAY | Freq: Four times a day (QID) | RESPIRATORY_TRACT | 6 refills | Status: DC | PRN

## 2019-01-04 MED ORDER — ALBUTEROL SULFATE (2.5 MG/3ML) 0.083% IN NEBU: 2.5000 mg | INHALATION_SOLUTION | Freq: Four times a day (QID) | RESPIRATORY_TRACT | 0 refills | Status: DC | PRN

## 2019-01-04 NOTE — Telephone Encounter (Signed)
Okay to refill medications. I did not see any information on this in his last ov notes

## 2019-02-08 ENCOUNTER — Other Ambulatory Visit: Payer: Self-pay | Admitting: Emergency Medicine

## 2019-02-08 ENCOUNTER — Other Ambulatory Visit: Payer: Self-pay | Admitting: *Deleted

## 2019-02-08 ENCOUNTER — Telehealth: Payer: Self-pay | Admitting: Emergency Medicine

## 2019-02-08 MED ORDER — ALBUTEROL SULFATE HFA 108 (90 BASE) MCG/ACT IN AERS: 2.0000 | INHALATION_SPRAY | Freq: Four times a day (QID) | RESPIRATORY_TRACT | 6 refills | Status: DC | PRN

## 2019-02-08 NOTE — Telephone Encounter (Signed)
Advised Dr Alvy Bimler patient needs Albuterol inhaler x 2 refilled. Per Dr Alvy Bimler okay. Left message in mobile voice mail Rx refilled.

## 2019-02-08 NOTE — Telephone Encounter (Signed)
Patient called requesting albuterol nebulizer solution refill be sent to [facility] pharmacy.

## 2019-02-11 ENCOUNTER — Telehealth: Payer: Self-pay | Admitting: General Practice

## 2019-02-11 NOTE — Telephone Encounter (Unsigned)
Copied from CRM [id number]. Topic: Quick Communication - Rx Refill/Question >> Feb 11, 2019  3:05 PM Mcneil, Ja-Kwan wrote: Medication: albuterol (PROVENTIL) (2.5 MG/3ML) 0.083% nebulizer solution  Has the patient contacted their pharmacy? yes   Preferred Pharmacy (with phone number or street name): [id number] [place], [place] - [phone] AT [facility] [address] [phone] (Phone)  [phone] (Fax)  Agent: Please be advised that RX refills may take up to 3 business days. We ask that you follow-up with your pharmacy.

## 2019-02-12 ENCOUNTER — Other Ambulatory Visit: Payer: Self-pay

## 2019-02-12 MED ORDER — ALBUTEROL SULFATE (2.5 MG/3ML) 0.083% IN NEBU: 2.5000 mg | INHALATION_SOLUTION | Freq: Four times a day (QID) | RESPIRATORY_TRACT | 0 refills | Status: DC | PRN

## 2019-02-12 NOTE — Telephone Encounter (Signed)
Rx was sent to pharmacy. 

## 2019-11-10 ENCOUNTER — Telehealth: Payer: Self-pay

## 2019-11-10 NOTE — Telephone Encounter (Signed)
Pt called back to clarify that they need prescription for the nebulizer machine as well

## 2019-11-10 NOTE — Telephone Encounter (Signed)
Pt. Called to request refill on albuterol for both the nebulizer and inhaler  To be sent to [facility] if filled.

## 2019-11-12 NOTE — Telephone Encounter (Signed)
Please advise 

## 2019-11-14 ENCOUNTER — Other Ambulatory Visit: Payer: Self-pay

## 2019-11-14 ENCOUNTER — Other Ambulatory Visit: Payer: Self-pay | Admitting: Emergency Medicine

## 2019-11-14 DIAGNOSIS — J4520 Mild intermittent asthma, uncomplicated: Secondary | ICD-10-CM

## 2019-11-14 MED ORDER — ALBUTEROL SULFATE (2.5 MG/3ML) 0.083% IN NEBU: 2.5000 mg | INHALATION_SOLUTION | Freq: Four times a day (QID) | RESPIRATORY_TRACT | 1 refills | Status: DC | PRN

## 2019-11-14 MED ORDER — ALBUTEROL SULFATE HFA 108 (90 BASE) MCG/ACT IN AERS: 2.0000 | INHALATION_SPRAY | Freq: Four times a day (QID) | RESPIRATORY_TRACT | 1 refills | Status: DC | PRN

## 2019-11-14 NOTE — Telephone Encounter (Signed)
Prescription sent.  Okay to order nebulizer machine for him as well.  Thanks

## 2019-11-14 NOTE — Telephone Encounter (Signed)
Pt mother calling to see if she can get a prescription for the nebulizer machine   [facility]  [phone]  [phone] Mother phone number

## 2019-11-14 NOTE — Telephone Encounter (Signed)
Rx written and signed by [place] and sent via fax to [phone] and confirmation received.  Pt's mother notified rx sent over to [facility].  Mom is wanting to know if they will have to pick up nebulizer or will it be brought out I assured her I was pretty sure they would deliver to the home.  Advised to contact our office if she has any further questions or concerns.  Mom agreeable.

## 2019-11-14 NOTE — Telephone Encounter (Signed)
Pt's mother calling back to find out what is going on with this and where to get Nebulizer.

## 2019-12-05 ENCOUNTER — Telehealth: Payer: Self-pay | Admitting: *Deleted

## 2019-12-05 NOTE — Telephone Encounter (Signed)
On 12/01/2019,faxed order for Nebulizer Kit and nebulizer compressor Attn: Lynder Parents. Confirmation page 5:17 pm.

## 2020-02-09 ENCOUNTER — Ambulatory Visit: Payer: BC Managed Care – PPO | Attending: Internal Medicine

## 2020-02-09 DIAGNOSIS — Z23 Encounter for immunization: Secondary | ICD-10-CM

## 2020-02-09 NOTE — Progress Notes (Signed)
   Covid-19 Vaccination Clinic  Name:  Craig Baker    MRN: [id number] DOB: 01-29-1996  02/09/2020  [name] was observed post Covid-19 immunization for 15 minutes without incident. He was provided with Vaccine Information Sheet and instruction to access the V-Safe system.   [name] was instructed to call 911 with any severe reactions post vaccine: Marland Kitchen Difficulty breathing  . Swelling of face and throat  . A fast heartbeat  . A bad rash all over body  . Dizziness and weakness   Immunizations Administered    Name Date Dose VIS Date Route   Pfizer COVID-19 Vaccine 02/09/2020  2:48 PM 0.3 mL 10/28/2019 Intramuscular   Manufacturer: [employer], [employer]   Lot: [id number]   NDC: [address]-1

## 2020-03-06 ENCOUNTER — Ambulatory Visit: Payer: BC Managed Care – PPO | Attending: Internal Medicine

## 2020-03-06 DIAGNOSIS — Z23 Encounter for immunization: Secondary | ICD-10-CM

## 2020-03-06 NOTE — Progress Notes (Signed)
   Covid-19 Vaccination Clinic  Name:  CARLETON VANVALKENBURGH    MRN: [id number] DOB: 1996/06/29  03/06/2020  Mr. Pickney was observed post Covid-19 immunization for 15 minutes without incident. He was provided with Vaccine Information Sheet and instruction to access the V-Safe system.   Mr. Sullenger was instructed to call 911 with any severe reactions post vaccine: Marland Kitchen Difficulty breathing  . Swelling of face and throat  . A fast heartbeat  . A bad rash all over body  . Dizziness and weakness   Immunizations Administered    Name Date Dose VIS Date Route   Pfizer COVID-19 Vaccine 03/06/2020  2:04 PM 0.3 mL 01/11/2019 Intramuscular   Manufacturer: [employer], [employer]   Lot: [id number]   NDC: [address]-7

## 2020-09-26 ENCOUNTER — Other Ambulatory Visit: Payer: Self-pay

## 2020-09-26 ENCOUNTER — Encounter: Payer: Self-pay | Admitting: Family Medicine

## 2020-09-26 ENCOUNTER — Ambulatory Visit: Payer: BC Managed Care – PPO | Admitting: Family Medicine

## 2020-09-26 VITALS — BP 136/85 | HR 70 | Temp 98.4°F | Ht 69.5 in | Wt 266.0 lb

## 2020-09-26 DIAGNOSIS — R29898 Other symptoms and signs involving the musculoskeletal system: Secondary | ICD-10-CM

## 2020-09-26 DIAGNOSIS — R142 Eructation: Secondary | ICD-10-CM | POA: Diagnosis not present

## 2020-09-26 DIAGNOSIS — K219 Gastro-esophageal reflux disease without esophagitis: Secondary | ICD-10-CM | POA: Diagnosis not present

## 2020-09-26 DIAGNOSIS — J4520 Mild intermittent asthma, uncomplicated: Secondary | ICD-10-CM | POA: Diagnosis not present

## 2020-09-26 MED ORDER — FAMOTIDINE 20 MG PO TABS: 20.0000 mg | ORAL_TABLET | Freq: Two times a day (BID) | ORAL | 0 refills | Status: AC

## 2020-09-26 MED ORDER — ALBUTEROL SULFATE HFA 108 (90 BASE) MCG/ACT IN AERS: 2.0000 | INHALATION_SPRAY | Freq: Four times a day (QID) | RESPIRATORY_TRACT | 3 refills | Status: AC | PRN

## 2020-09-26 MED ORDER — ALBUTEROL SULFATE (2.5 MG/3ML) 0.083% IN NEBU: 2.5000 mg | INHALATION_SOLUTION | Freq: Four times a day (QID) | RESPIRATORY_TRACT | 3 refills | Status: AC | PRN

## 2020-09-26 NOTE — Progress Notes (Addendum)
11/10/202110:38 AM  Craig Baker 30-Oct-1996, 24 y.o., male [id number]  Chief Complaint  Patient presents with  . burping    chronic and constant throughout the day - has tried otc antacids  . R side jaw click    2 yrs      HPI:   Patient is a 24 y.o. male with past medical history significant for asthma, allergies, OSA who presents today for Burping and jaw click.  Burping Been happening for a year or two When starts is continuous Denies heartburn, n/v, constipation, diarrhea Infrequent and large meals Does have issues with bloating Tums stopped burping, but left a knot in chest    Jaw Click Occurs when opens widely or move jaw to the side For 2 years Doesn't hurt but it is noticeable Is able to reproduce this in the clinic Clicks with movement Does go to the dentist every 6 months     Depression screen [facility] 2/9 09/26/2020 10/15/2018 05/26/2016  Decreased Interest 0 0 1  Down, Depressed, Hopeless 0 0 0  PHQ - 2 Score 0 0 1  Altered sleeping - - 1  Tired, decreased energy - - 1  Change in appetite - - 1  Feeling bad or failure about yourself  - - 0  Trouble concentrating - - 0  Moving slowly or fidgety/restless - - 0  Suicidal thoughts - - 0  PHQ-9 Score - - 4    Fall Risk  09/26/2020 10/15/2018  Falls in the past year? 0 0  Number falls in past yr: 0 -  Injury with Fall? 0 -  Follow up Falls evaluation completed -     Allergies  Allergen Reactions  . Fish Allergy Shortness Of Breath and Itching    Prior to Admission medications   Medication Sig Start Date End Date Taking? Authorizing Provider  albuterol (PROVENTIL) (2.5 MG/3ML) 0.083% nebulizer solution Take 3 mLs (2.5 mg total) by nebulization every 6 (six) hours as needed for wheezing or shortness of breath. 11/14/19   Georgina Quint, MD  albuterol (VENTOLIN HFA) 108 (90 Base) MCG/ACT inhaler Inhale 2 puffs into the lungs every 6 (six) hours as needed for wheezing or shortness of breath.  11/14/19   Georgina Quint, MD  loratadine (CLARITIN) 10 MG tablet Take 1 tablet (10 mg total) by mouth daily. 10/26/14   Preston Fleeting, MD    Past Medical History:  Diagnosis Date  . Allergy   . Asthma    much improved.  "Rarely" has wheeze"  . Eczema   . Obesity    BMI 95-99%  . OSA (obstructive sleep apnea)   . Sickle cell trait [facility])     Past Surgical History:  Procedure Laterality Date  . ADENOIDECTOMY  2004  . TONSILLECTOMY      Social History   Tobacco Use  . Smoking status: Never Smoker  . Smokeless tobacco: Never Used  Substance Use Topics  . Alcohol use: No    Family History  Problem Relation Age of Onset  . Asthma Mother   . Asthma Maternal Grandmother   . Diabetes Paternal Grandmother   . Hypertension Paternal Grandmother   . Alcohol abuse Neg Hx   . Arthritis Neg Hx   . Birth defects Neg Hx   . Cancer Neg Hx   . COPD Neg Hx   . Depression Neg Hx   . Drug abuse Neg Hx   . Early death Neg Hx   . Hearing  loss Neg Hx   . Heart disease Neg Hx   . Hyperlipidemia Neg Hx   . Kidney disease Neg Hx   . Learning disabilities Neg Hx   . Mental illness Neg Hx   . Miscarriages / Stillbirths Neg Hx   . Mental retardation Neg Hx   . Stroke Neg Hx   . Vision loss Neg Hx     Review of Systems  Constitutional: Negative for chills, fever and malaise/fatigue.  Eyes: Negative for blurred vision and double vision.  Respiratory: Negative for cough, shortness of breath and wheezing.   Cardiovascular: Negative for chest pain, palpitations and leg swelling.  Gastrointestinal: Negative for abdominal pain, blood in stool, constipation, diarrhea, heartburn, nausea and vomiting.       Frequent Burping  Genitourinary: Negative for dysuria, frequency and hematuria.  Musculoskeletal: Negative for back pain and joint pain.       Jaw click with movement  Skin: Negative for rash.  Neurological: Negative for dizziness, weakness and headaches.      OBJECTIVE:  Today's Vitals   09/26/20 1000  BP: 136/85  Pulse: 70  Temp: 98.4 F (36.9 C)  SpO2: 97%  Weight: 266 lb (120.7 kg)  Height: 5' 9.5" (1.765 m)   Body mass index is 38.72 kg/m.   Physical Exam Vitals reviewed.  Constitutional:      Appearance: Normal appearance.  HENT:     Head: Normocephalic and atraumatic.     Jaw: No tenderness or pain on movement (click with movement).  Eyes:     Conjunctiva/sclera: Conjunctivae normal.     Pupils: Pupils are equal, round, and reactive to light.  Cardiovascular:     Rate and Rhythm: Normal rate and regular rhythm.     Pulses: Normal pulses.     Heart sounds: Normal heart sounds. No murmur heard.  No friction rub. No gallop.   Pulmonary:     Effort: Pulmonary effort is normal. No respiratory distress.     Breath sounds: Normal breath sounds. No stridor. No wheezing or rales.  Abdominal:     General: Bowel sounds are normal.     Palpations: Abdomen is soft.     Tenderness: There is no abdominal tenderness.  Musculoskeletal:     Right lower leg: No edema.     Left lower leg: No edema.  Skin:    General: Skin is warm and dry.  Neurological:     General: No focal deficit present.     Mental Status: He is alert and oriented to person, place, and time.  Psychiatric:        Mood and Affect: Mood normal.        Behavior: Behavior normal.     No results found for this or any previous visit (from the past 24 hour(s)).  No results found.   ASSESSMENT and PLAN  Problem List Items Addressed This Visit      Respiratory   Asthma   Relevant Medications   albuterol (PROVENTIL) (2.5 MG/3ML) 0.083% nebulizer solution   albuterol (VENTOLIN HFA) 108 (90 Base) MCG/ACT inhaler  Discussed if he needed the nebulizer and inhaler Discussed potentially using only the inhaler and not the nebulizer Will follow up at next appointment    Other Visit Diagnoses    TMJ click    -  Primary Encouraged to follow up with  dentist Denies pain at this time   Gastroesophageal reflux disease without esophagitis/ Frequent Burpint     Relevant Medications  famotidine (PEPCID) 20 MG tablet bid for 2 months Will follow up at the end to determine long term management Discussed lifestyle changes     Encouraged to schedule annual physical   Return in about 2 months (around 11/26/2020) for GERD f/u.   I have reviewed and agree with above documentation. Edwina Barth, MD  Macario Carls Saifullah Jolley, FNP-BC Primary Care at [facility] [address] [place], [place] [address] Ph.  [phone] Fax [phone]

## 2020-09-26 NOTE — Patient Instructions (Addendum)
Follow up with dentist for TMJ click. Take famotidine twice a day for 2 months. Follow up at that time for long term management and eval    Gastroesophageal Reflux Disease, Adult Gastroesophageal reflux (GER) happens when acid from the stomach flows up into the tube that connects the mouth and the stomach (esophagus). Normally, food travels down the esophagus and stays in the stomach to be digested. With GER, food and stomach acid sometimes move back up into the esophagus. You may have a disease called gastroesophageal reflux disease (GERD) if the reflux:  Happens often.  Causes frequent or very bad symptoms.  Causes problems such as damage to the esophagus. When this happens, the esophagus becomes sore and swollen (inflamed). Over time, GERD can make small holes (ulcers) in the lining of the esophagus. What are the causes? This condition is caused by a problem with the muscle between the esophagus and the stomach. When this muscle is weak or not normal, it does not close properly to keep food and acid from coming back up from the stomach. The muscle can be weak because of:  Tobacco use.  Pregnancy.  Having a certain type of hernia (hiatal hernia).  Alcohol use.  Certain foods and drinks, such as coffee, chocolate, onions, and peppermint. What increases the risk? You are more likely to develop this condition if you:  Are overweight.  Have a disease that affects your connective tissue.  Use NSAID medicines. What are the signs or symptoms? Symptoms of this condition include:  Heartburn.  Difficult or painful swallowing.  The feeling of having a lump in the throat.  A bitter taste in the mouth.  Bad breath.  Having a lot of saliva.  Having an upset or bloated stomach.  Belching.  Chest pain. Different conditions can cause chest pain. Make sure you see your doctor if you have chest pain.  Shortness of breath or noisy breathing (wheezing).  Ongoing (chronic) cough  or a cough at night.  Wearing away of the surface of teeth (tooth enamel).  Weight loss. How is this treated? Treatment will depend on how bad your symptoms are. Your doctor may suggest:  Changes to your diet.  Medicine.  Surgery. Follow these instructions at home: Eating and drinking   Follow a diet as told by your doctor. You may need to avoid foods and drinks such as: ? Coffee and tea (with or without caffeine). ? Drinks that contain alcohol. ? Energy drinks and sports drinks. ? Bubbly (carbonated) drinks or sodas. ? Chocolate and cocoa. ? Peppermint and mint flavorings. ? Garlic and onions. ? Horseradish. ? Spicy and acidic foods. These include peppers, chili powder, curry powder, vinegar, hot sauces, and BBQ sauce. ? Citrus fruit juices and citrus fruits, such as oranges, lemons, and limes. ? Tomato-based foods. These include red sauce, chili, salsa, and pizza with red sauce. ? Fried and fatty foods. These include donuts, french fries, potato chips, and high-fat dressings. ? High-fat meats. These include hot dogs, rib eye steak, sausage, ham, and bacon. ? High-fat dairy items, such as whole milk, butter, and cream cheese.  Eat small meals often. Avoid eating large meals.  Avoid drinking large amounts of liquid with your meals.  Avoid eating meals during the 2-3 hours before bedtime.  Avoid lying down right after you eat.  Do not exercise right after you eat. Lifestyle   Do not use any products that contain nicotine or tobacco. These include cigarettes, e-cigarettes, and chewing tobacco. If you  need help quitting, ask your doctor.  Try to lower your stress. If you need help doing this, ask your doctor.  If you are overweight, lose an amount of weight that is healthy for you. Ask your doctor about a safe weight loss goal. General instructions  Pay attention to any changes in your symptoms.  Take over-the-counter and prescription medicines only as told by your  doctor. Do not take aspirin, ibuprofen, or other NSAIDs unless your doctor says it is okay.  Wear loose clothes. Do not wear anything tight around your waist.  Raise (elevate) the head of your bed about 6 inches (15 cm).  Avoid bending over if this makes your symptoms worse.  Keep all follow-up visits as told by your doctor. This is important. Contact a doctor if:  You have new symptoms.  You lose weight and you do not know why.  You have trouble swallowing or it hurts to swallow.  You have wheezing or a cough that keeps happening.  Your symptoms do not get better with treatment.  You have a hoarse voice. Get help right away if:  You have pain in your arms, neck, jaw, teeth, or back.  You feel sweaty, dizzy, or light-headed.  You have chest pain or shortness of breath.  You throw up (vomit) and your throw-up looks like blood or coffee grounds.  You pass out (faint).  Your poop (stool) is bloody or black.  You cannot swallow, drink, or eat. Summary  If a person has gastroesophageal reflux disease (GERD), food and stomach acid move back up into the esophagus and cause symptoms or problems such as damage to the esophagus.  Treatment will depend on how bad your symptoms are.  Follow a diet as told by your doctor.  Take all medicines only as told by your doctor. This information is not intended to replace advice given to you by your health care provider. Make sure you discuss any questions you have with your health care provider. Document Revised: 05/12/2018 Document Reviewed: 05/12/2018 Elsevier Patient Education  [employer].  If you have lab work done today you will be contacted with your lab results within the next 2 weeks.  If you have not heard from [place] then please contact us. The fastest way to get your results is to register for My Chart.   IF you received an x-ray today, you will receive an invoice from [facility]. Please contact [facility]  Radiology at [phone] with questions or concerns regarding your invoice.   IF you received labwork today, you will receive an invoice from [place]. Please contact [facility] at [phone] with questions or concerns regarding your invoice.   Our billing staff will not be able to assist you with questions regarding bills from these companies.  You will be contacted with the lab results as soon as they are available. The fastest way to get your results is to activate your My Chart account. Instructions are located on the last page of this paperwork. If you have not heard from [place] regarding the results in 2 weeks, please contact this office.      Temporomandibular Joint Syndrome  Temporomandibular joint syndrome (TMJ syndrome) is a condition that causes pain in the temporomandibular joints. These joints are located near your ears and allow your jaw to open and close. For people with TMJ syndrome, chewing, biting, or other movements of the jaw can be difficult or painful. TMJ syndrome is often mild and goes away within a few  weeks. However, sometimes the condition becomes a long-term (chronic) problem. What are the causes? This condition may be caused by:  Grinding your teeth or clenching your jaw. Some people do this when they are under stress.  Arthritis.  Injury to the jaw.  Head or neck injury.  Teeth or dentures that are not aligned well. In some cases, the cause of TMJ syndrome may not be known. What are the signs or symptoms? The most common symptom of this condition is an aching pain on the side of the head in the area of the TMJ. Other symptoms may include:  Pain when moving your jaw, such as when chewing or biting.  Being unable to open your jaw all the way.  Making a clicking sound when you open your mouth.  Headache.  Earache.  Neck or shoulder pain. How is this diagnosed? This condition may be diagnosed based on:  Your symptoms and medical history.  A  physical exam. Your health care provider may check the range of motion of your jaw.  Imaging tests, such as X-rays or an MRI. You may also need to see your dentist, who will determine if your teeth and jaw are lined up correctly. How is this treated? TMJ syndrome often goes away on its own. If treatment is needed, the options may include:  Eating soft foods and applying ice or heat.  Medicines to relieve pain or inflammation.  Medicines or massage to relax the muscles.  A splint, bite plate, or mouthpiece to prevent teeth grinding or jaw clenching.  Relaxation techniques or counseling to help reduce stress.  A therapy for pain in which an electrical current is applied to the nerves through the skin (transcutaneous electrical nerve stimulation).  Acupuncture. This is sometimes helpful to relieve pain.  Jaw surgery. This is rarely needed. Follow these instructions at home:  Eating and drinking  Eat a soft diet if you are having trouble chewing.  Avoid foods that require a lot of chewing. Do not chew gum. General instructions  Take over-the-counter and prescription medicines only as told by your health care provider.  If directed, put ice on the painful area. ? Put ice in a plastic bag. ? Place a towel between your skin and the bag. ? Leave the ice on for 20 minutes, 2-3 times a day.  Apply a warm, wet cloth (warm compress) to the painful area as directed.  Massage your jaw area and do any jaw stretching exercises as told by your health care provider.  If you were given a splint, bite plate, or mouthpiece, wear it as told by your health care provider.  Keep all follow-up visits as told by your health care provider. This is important. Contact a health care provider if:  You are having trouble eating.  You have new or worsening symptoms. Get help right away if:  Your jaw locks open or closed. Summary  Temporomandibular joint syndrome (TMJ syndrome) is a condition  that causes pain in the temporomandibular joints. These joints are located near your ears and allow your jaw to open and close.  TMJ syndrome is often mild and goes away within a few weeks. However, sometimes the condition becomes a long-term (chronic) problem.  Symptoms include an aching pain on the side of the head in the area of the TMJ, pain when chewing or biting, and being unable to open your jaw all the way. You may also make a clicking sound when you open your mouth.  TMJ  syndrome often goes away on its own. If treatment is needed, it may include medicines to relieve pain, reduce inflammation, or relax the muscles. A splint, bite plate, or mouthpiece may also be used to prevent teeth grinding or jaw clenching. This information is not intended to replace advice given to you by your health care provider. Make sure you discuss any questions you have with your health care provider. Document Revised: 01/15/2018 Document Reviewed: 12/15/2017 Elsevier Patient Education  2020 [employer].

## 2020-12-11 ENCOUNTER — Encounter: Payer: BC Managed Care – PPO | Admitting: Family Medicine

## 2020-12-12 ENCOUNTER — Encounter: Payer: Self-pay | Admitting: Family Medicine

## 2020-12-12 ENCOUNTER — Other Ambulatory Visit: Payer: Self-pay

## 2020-12-12 ENCOUNTER — Ambulatory Visit: Payer: BC Managed Care – PPO | Admitting: Family Medicine

## 2020-12-12 VITALS — BP 135/83 | HR 75 | Temp 97.6°F | Ht 69.5 in | Wt 268.0 lb

## 2020-12-12 DIAGNOSIS — K219 Gastro-esophageal reflux disease without esophagitis: Secondary | ICD-10-CM | POA: Diagnosis not present

## 2020-12-12 DIAGNOSIS — Z0000 Encounter for general adult medical examination without abnormal findings: Secondary | ICD-10-CM

## 2020-12-12 DIAGNOSIS — D582 Other hemoglobinopathies: Secondary | ICD-10-CM

## 2020-12-12 MED ORDER — PANTOPRAZOLE SODIUM 40 MG PO TBEC: 40.0000 mg | DELAYED_RELEASE_TABLET | Freq: Every day | ORAL | 0 refills | Status: AC

## 2020-12-12 NOTE — Patient Instructions (Addendum)
   Gastroesophageal Reflux Disease, Adult  Gastroesophageal reflux (GER) happens when acid from the stomach flows up into the tube that connects the mouth and the stomach (esophagus). Normally, food travels down the esophagus and stays in the stomach to be digested. With GER, food and stomach acid sometimes move back up into the esophagus. You may have a disease called gastroesophageal reflux disease (GERD) if the reflux:  Happens often.  Causes frequent or very bad symptoms.  Causes problems such as damage to the esophagus. When this happens, the esophagus becomes sore and swollen. Over time, GERD can make small holes (ulcers) in the lining of the esophagus. What are the causes? This condition is caused by a problem with the muscle between the esophagus and the stomach. When this muscle is weak or not normal, it does not close properly to keep food and acid from coming back up from the stomach. The muscle can be weak because of:  Tobacco use.  Pregnancy.  Having a certain type of hernia (hiatal hernia).  Alcohol use.  Certain foods and drinks, such as coffee, chocolate, onions, and peppermint. What increases the risk?  Being overweight.  Having a disease that affects your connective tissue.  Taking NSAIDs, such a ibuprofen. What are the signs or symptoms?  Heartburn.  Difficult or painful swallowing.  The feeling of having a lump in the throat.  A bitter taste in the mouth.  Bad breath.  Having a lot of saliva.  Having an upset or bloated stomach.  Burping.  Chest pain. Different conditions can cause chest pain. Make sure you see your doctor if you have chest pain.  Shortness of breath or wheezing.  A long-term cough or a cough at night.  Wearing away of the surface of teeth (tooth enamel).  Weight loss. How is this treated?  Making changes to your diet.  Taking medicine.  Having surgery. Treatment will depend on how bad your symptoms  are. Follow these instructions at home: Eating and drinking  Follow a diet as told by your doctor. You may need to avoid foods and drinks such as: ? Coffee and tea, with or without caffeine. ? Drinks that contain alcohol. ? Energy drinks and sports drinks. ? Bubbly (carbonated) drinks or sodas. ? Chocolate and cocoa. ? Peppermint and mint flavorings. ? Garlic and onions. ? Horseradish. ? Spicy and acidic foods. These include peppers, chili powder, curry powder, vinegar, hot sauces, and BBQ sauce. ? Citrus fruit juices and citrus fruits, such as oranges, lemons, and limes. ? Tomato-based foods. These include red sauce, chili, salsa, and pizza with red sauce. ? Fried and fatty foods. These include donuts, french fries, potato chips, and high-fat dressings. ? High-fat meats. These include hot dogs, rib eye steak, sausage, ham, and bacon. ? High-fat dairy items, such as whole milk, butter, and cream cheese.  Eat small meals often. Avoid eating large meals.  Avoid drinking large amounts of liquid with your meals.  Avoid eating meals during the 2-3 hours before bedtime.  Avoid lying down right after you eat.  Do not exercise right after you eat.   Lifestyle  Do not smoke or use any products that contain nicotine or tobacco. If you need help quitting, ask your doctor.  Try to lower your stress. If you need help doing this, ask your doctor.  If you are overweight, lose an amount of weight that is healthy for you. Ask your doctor about a safe weight loss goal.     Pay attention to any changes in your symptoms.  Take over-the-counter and prescription medicines only as told by your doctor.  Do not take aspirin, ibuprofen, or other NSAIDs unless your doctor says it is okay.  Wear loose clothes. Do not wear anything tight around your waist.  Raise (elevate) the head of your bed about 6 inches (15 cm). You may need to use a wedge to do this.  Avoid bending over if this makes your  symptoms worse.  Keep all follow-up visits. Contact a doctor if:  You have new symptoms.  You lose weight and you do not know why.  You have trouble swallowing or it hurts to swallow.  You have wheezing or a cough that keeps happening.  You have a hoarse voice.  Your symptoms do not get better with treatment. Get help right away if:  You have sudden pain in your arms, neck, jaw, teeth, or back.  You suddenly feel sweaty, dizzy, or light-headed.  You have chest pain or shortness of breath.  You vomit and the vomit is green, yellow, or black, or it looks like blood or coffee grounds.  You faint.  Your poop (stool) is red, bloody, or black.  You cannot swallow, drink, or eat. These symptoms may represent a serious problem that is an emergency. Do not wait to see if the symptoms will go away. Get medical help right away. Call your local emergency services (911 in the [place] Do not drive yourself to the hospital. Summary  If a person has gastroesophageal reflux disease (GERD), food and stomach acid move back up into the esophagus and cause symptoms or problems such as damage to the esophagus.  Treatment will depend on how bad your symptoms are.  Follow a diet as told by your doctor.  Take all medicines only as told by your doctor. This information is not intended to replace advice given to you by your health care provider. Make sure you discuss any questions you have with your health care provider. Document Revised: 05/14/2020 Document Reviewed: 05/14/2020 Elsevier Patient Education  2021 [employer]     If you have lab work done today you will be contacted with your lab results within the next 2 weeks.  If you have not heard from us then please contact us. The fastest way to get your results is to register for My Chart.   IF you received an x-ray today, you will receive an invoice from [facility]. Please contact [facility] at [phone] with  questions or concerns regarding your invoice.   IF you received labwork today, you will receive an invoice from [facility]. Please contact [facility] at [phone] with questions or concerns regarding your invoice.   Our billing staff will not be able to assist you with questions regarding bills from these companies.  You will be contacted with the lab results as soon as they are available. The fastest way to get your results is to activate your My Chart account. Instructions are located on the last page of this paperwork. If you have not heard from us regarding the results in 2 weeks, please contact this office.      

## 2020-12-12 NOTE — Progress Notes (Signed)
1/26/20221:50 PM  Craig Baker 12-09-1995, 25 y.o., male [id number]  Chief Complaint  Patient presents with  . Gastroesophageal Reflux    Stated medification prescribed not helping , still having issues with consistent belching     HPI:   Patient is a 25 y.o. male with past medical history significant for asthma, allergies who presents today for GERD f/u  GERD/ Frequent Burping Started famotidine 2 months ago  Was taking this medication daily Doesn't feel like symptoms have improved Burping continues  Had a click in his jaw: saw dentist Recommended he follow up but decided not to   Depression screen [facility] 2/9 12/12/2020 09/26/2020 10/15/2018  Decreased Interest 0 0 0  Down, Depressed, Hopeless 0 0 0  PHQ - 2 Score 0 0 0  Altered sleeping - - -  Tired, decreased energy - - -  Change in appetite - - -  Feeling bad or failure about yourself  - - -  Trouble concentrating - - -  Moving slowly or fidgety/restless - - -  Suicidal thoughts - - -  PHQ-9 Score - - -    Fall Risk  12/12/2020 09/26/2020 10/15/2018  Falls in the past year? 0 0 0  Number falls in past yr: 0 0 -  Injury with Fall? 0 0 -  Follow up Falls evaluation completed Falls evaluation completed -     Allergies  Allergen Reactions  . Fish Allergy Shortness Of Breath and Itching    Prior to Admission medications   Medication Sig Start Date End Date Taking? Authorizing Provider  albuterol (PROVENTIL) (2.5 MG/3ML) 0.083% nebulizer solution Take 3 mLs (2.5 mg total) by nebulization every 6 (six) hours as needed for wheezing or shortness of breath. 09/26/20   Jermery Caratachea, Laurita Quint, FNP  albuterol (VENTOLIN HFA) 108 (90 Base) MCG/ACT inhaler Inhale 2 puffs into the lungs every 6 (six) hours as needed for wheezing or shortness of breath. 09/26/20   Nguyen Butler, Laurita Quint, FNP  famotidine (PEPCID) 20 MG tablet Take 1 tablet (20 mg total) by mouth 2 (two) times daily. 09/26/20   Christol Thetford, Laurita Quint, FNP  loratadine (CLARITIN) 10  MG tablet Take 1 tablet (10 mg total) by mouth daily. 10/26/14   Maurice March, MD    Past Medical History:  Diagnosis Date  . Allergy   . Asthma    much improved.  "Rarely" has wheeze"  . Asthma    [employer]reesia 12/08/2020  . Eczema   . Obesity    BMI 95-99%  . OSA (obstructive sleep apnea)   . Sickle cell trait [facility])     Past Surgical History:  Procedure Laterality Date  . ADENOIDECTOMY  2004  . TONSILLECTOMY      Social History   Tobacco Use  . Smoking status: Never Smoker  . Smokeless tobacco: Never Used  Substance Use Topics  . Alcohol use: No    Family History  Problem Relation Age of Onset  . Asthma Mother   . Asthma Maternal Grandmother   . Diabetes Paternal Grandmother   . Hypertension Paternal Grandmother   . Alcohol abuse Neg Hx   . Arthritis Neg Hx   . Birth defects Neg Hx   . Cancer Neg Hx   . COPD Neg Hx   . Depression Neg Hx   . Drug abuse Neg Hx   . Early death Neg Hx   . Hearing loss Neg Hx   . Heart disease Neg Hx   . Hyperlipidemia  Neg Hx   . Kidney disease Neg Hx   . Learning disabilities Neg Hx   . Mental illness Neg Hx   . Miscarriages / Stillbirths Neg Hx   . Mental retardation Neg Hx   . Stroke Neg Hx   . Vision loss Neg Hx     Review of Systems  Respiratory: Negative for cough, sputum production, shortness of breath and wheezing.   Cardiovascular: Negative for chest pain and palpitations.  Gastrointestinal: Positive for heartburn. Negative for abdominal pain, constipation, diarrhea, nausea and vomiting.  Neurological: Negative for headaches.     OBJECTIVE:  Today's Vitals   12/12/20 1318  BP: 135/83  Pulse: 75  Temp: 97.6 F (36.4 C)  SpO2: 97%  Weight: 268 lb (121.6 kg)  Height: 5' 9.5" (1.765 m)   Body mass index is 39.01 kg/m.   Physical Exam Vitals reviewed.  Constitutional:      Appearance: Normal appearance.  HENT:     Head: Normocephalic and atraumatic.  Eyes:     Conjunctiva/sclera: Conjunctivae  normal.     Pupils: Pupils are equal, round, and reactive to light.  Cardiovascular:     Rate and Rhythm: Normal rate and regular rhythm.     Pulses: Normal pulses.     Heart sounds: Normal heart sounds. No murmur heard. No friction rub. No gallop.   Pulmonary:     Effort: Pulmonary effort is normal. No respiratory distress.     Breath sounds: Normal breath sounds. No stridor. No wheezing or rales.  Abdominal:     General: Bowel sounds are normal.     Palpations: Abdomen is soft.     Tenderness: There is no abdominal tenderness.  Musculoskeletal:     Right lower leg: No edema.     Left lower leg: No edema.  Skin:    General: Skin is warm and dry.  Neurological:     General: No focal deficit present.     Mental Status: He is alert and oriented to person, place, and time.  Psychiatric:        Mood and Affect: Mood normal.        Behavior: Behavior normal.     No results found for this or any previous visit (from the past 24 hour(s)).  No results found.   ASSESSMENT and PLAN  Problem List Items Addressed This Visit      Other   Hemoglobin C trait ([place])    Other Visit Diagnoses    Gastroesophageal reflux disease without esophagitis    -  Primary   Relevant Medications   pantoprazole (PROTONIX) 40 MG tablet   Other Relevant Orders   H. pylori breath test   Annual physical exam       Relevant Orders   CBC   CMP14+EGFR   Lipid Panel   TSH   Vitamin D, 25-hydroxy   Hemoglobin A1c   HIV Antibody (routine testing w rflx)   Hepatitis C antibody       Plan . Take protonix daily . If not improvement will refer to GI . Will follow up with lab results   Return in about 2 months (around 02/09/2021).    Huston Foley Otisha Spickler, FNP-BC [facility] [place], [place] [address] Ph.  [phone] Fax [phone]

## 2020-12-13 LAB — LIPID PANEL
Chol/HDL Ratio: 3.5 ratio (ref 0.0–5.0)
Cholesterol, Total: 128 mg/dL (ref 100–199)
HDL: 37 mg/dL — ABNORMAL LOW (ref >39)
LDL Chol Calc (NIH): 75 mg/dL (ref 0–99)
Triglycerides: 79 mg/dL (ref 0–149)
VLDL Cholesterol Cal: 16 mg/dL (ref 5–40)

## 2020-12-13 LAB — CMP14+EGFR
ALT: 26 IU/L (ref 0–44)
AST: 22 IU/L (ref 0–40)
Albumin/Globulin Ratio: 1.2 (ref 1.2–2.2)
Albumin: 4 g/dL — ABNORMAL LOW (ref 4.1–5.2)
Alkaline Phosphatase: 100 IU/L (ref 44–121)
BUN/Creatinine Ratio: 13 (ref 9–20)
BUN: 13 mg/dL (ref 6–20)
Bilirubin Total: 0.3 mg/dL (ref 0.0–1.2)
CO2: 25 mmol/L (ref 20–29)
Calcium: 9.5 mg/dL (ref 8.7–10.2)
Chloride: 103 mmol/L (ref 96–106)
Creatinine, Ser: 1 mg/dL (ref 0.76–1.27)
GFR calc Af Amer: 122 mL/min/{1.73_m2} (ref >59)
GFR calc non Af Amer: 106 mL/min/{1.73_m2} (ref >59)
Globulin, Total: 3.4 g/dL (ref 1.5–4.5)
Glucose: 85 mg/dL (ref 65–99)
Potassium: 4.2 mmol/L (ref 3.5–5.2)
Sodium: 140 mmol/L (ref 134–144)
Total Protein: 7.4 g/dL (ref 6.0–8.5)

## 2020-12-13 LAB — CBC
Hematocrit: 41.8 % (ref 37.5–51.0)
Hemoglobin: 14 g/dL (ref 13.0–17.7)
MCH: 25.6 pg — ABNORMAL LOW (ref 26.6–33.0)
MCHC: 33.5 g/dL (ref 31.5–35.7)
MCV: 76 fL — ABNORMAL LOW (ref 79–97)
Platelets: 280 10*3/uL (ref 150–450)
RBC: 5.47 x10E6/uL (ref 4.14–5.80)
RDW: 12.1 % (ref 11.6–15.4)
WBC: 8.5 10*3/uL (ref 3.4–10.8)

## 2020-12-13 LAB — HEMOGLOBIN A1C
Est. average glucose Bld gHb Est-mCnc: 88 mg/dL
Hgb A1c MFr Bld: 4.7 % — ABNORMAL LOW (ref 4.8–5.6)

## 2020-12-13 LAB — HEPATITIS C ANTIBODY: Hep C Virus Ab: 0.1 s/co ratio (ref 0.0–0.9)

## 2020-12-13 LAB — TSH: TSH: 0.909 u[IU]/mL (ref 0.450–4.500)

## 2020-12-13 LAB — HIV ANTIBODY (ROUTINE TESTING W REFLEX): HIV Screen 4th Generation wRfx: NONREACTIVE

## 2020-12-13 LAB — VITAMIN D 25 HYDROXY (VIT D DEFICIENCY, FRACTURES): Vit D, 25-Hydroxy: 14.4 ng/mL — ABNORMAL LOW (ref 30.0–100.0)

## 2020-12-13 LAB — H. PYLORI BREATH TEST: H pylori Breath Test: POSITIVE — AB

## 2020-12-13 NOTE — Progress Notes (Signed)
Overall labs look good as expected. Vitamin D is low I would recommend taking a 2000 IU OTC supplement of Vitamin D3 daily with food.

## 2020-12-16 ENCOUNTER — Other Ambulatory Visit: Payer: Self-pay | Admitting: Family Medicine

## 2020-12-16 DIAGNOSIS — A048 Other specified bacterial intestinal infections: Secondary | ICD-10-CM

## 2020-12-16 MED ORDER — AMOXICILLIN 500 MG PO CAPS
1000.0000 mg | ORAL_CAPSULE | Freq: Two times a day (BID) | ORAL | 0 refills | Status: AC
Start: 2020-12-16 — End: 2020-12-30

## 2020-12-16 MED ORDER — CLARITHROMYCIN 500 MG PO TABS: 500.0000 mg | ORAL_TABLET | Freq: Two times a day (BID) | ORAL | 0 refills | Status: AC

## 2020-12-16 NOTE — Progress Notes (Signed)
If you could let him know his h pylori came back positive. This is most likely the reason for this reflux symptoms and is an easily treatable infection. This is treated with his daily Protonix and 2 weeks of twice a day antibiotics (Clarithromycin and Amoxicillin) I sent both of these to his pharmacy.

## 2021-02-11 ENCOUNTER — Ambulatory Visit: Payer: BC Managed Care – PPO | Admitting: Family Medicine

## 2021-06-30 ENCOUNTER — Encounter (HOSPITAL_COMMUNITY): Payer: Self-pay | Admitting: Emergency Medicine

## 2021-06-30 ENCOUNTER — Emergency Department (HOSPITAL_COMMUNITY): Payer: BC Managed Care – PPO

## 2021-06-30 ENCOUNTER — Emergency Department (HOSPITAL_COMMUNITY)
Admission: EM | Admit: 2021-06-30 | Discharge: 2021-06-30 | Disposition: A | Discharge status: Home / Self Care | Payer: BC Managed Care – PPO | Attending: Emergency Medicine | Admitting: Emergency Medicine

## 2021-06-30 ENCOUNTER — Other Ambulatory Visit: Payer: Self-pay

## 2021-06-30 DIAGNOSIS — W500XXA Accidental hit or strike by another person, initial encounter: Secondary | ICD-10-CM | POA: Diagnosis not present

## 2021-06-30 DIAGNOSIS — J45909 Unspecified asthma, uncomplicated: Secondary | ICD-10-CM | POA: Insufficient documentation

## 2021-06-30 DIAGNOSIS — S0990XA Unspecified injury of head, initial encounter: Secondary | ICD-10-CM

## 2021-06-30 DIAGNOSIS — Y9366 Activity, soccer: Secondary | ICD-10-CM | POA: Diagnosis not present

## 2021-06-30 NOTE — ED Triage Notes (Signed)
Pt states he was kneed on L side of head while playing goalie in soccer game around 4:30pm.  C/o pain to L side of head that is worse when opening and closing mouth.  Denies LOC.

## 2021-06-30 NOTE — ED Provider Notes (Signed)
Emergency Medicine Provider Triage Evaluation Note  Craig Baker , a 25 y.o. male  was evaluated in triage.  Pt complains of head injury happening around 4 PM this afternoon.  Patient states he was the goalkeeper and was kneed by another player on the left side of his head.  He denies any loss of consciousness or fall to the ground.  He states a few hours after it happened he started to notice he had a headache and some pain localized to that area.  He also admits to pain when opening his mouth.  No meds prior to arrival.  Review of Systems  Positive: Headache Negative: Visual changes, neck pain, nausea, vomiting, dizziness, lightheadedness, numbness, weakness  Physical Exam  BP 128/87 (BP Location: Right Arm)   Pulse (!) 102   Temp 98.3 F (36.8 C)   Resp 18   SpO2 96%  Gen:   Awake, no distress   Resp:  Normal effort  MSK:   Moves extremities without difficulty  Other:  Tenderness to palpation of left temple.  No crepitus appreciated.  No pain with EOMs.  No tenderness of facial bones  Medical Decision Making  Medically screening exam initiated at 7:18 PM.  Appropriate orders placed.  Carolee Rota was informed that the remainder of the evaluation will be completed by another provider, this initial triage assessment does not replace that evaluation, and the importance of remaining in the ED until their evaluation is complete.  Head CT ordered.  Patient declines need for Tylenol.   Portions of this note were generated with Scientist, clinical (histocompatibility and immunogenetics). Dictation errors may occur despite best attempts at proofreading.    Shanon Ace, PA-C 06/30/21 1918    Sloan Leiter, DO 06/30/21 2213

## 2021-06-30 NOTE — Discharge Instructions (Addendum)
You were seen in the emergency department today following a head injury.  We suspect that you have a concussion, otherwise known and as a mild traumatic brain injury.  Your CT scan did not show any new abnormality such as a brain bleed.   1. Medications: Ibuprofen or Tylenol for pain 2. Treatment: Rest, ice on head.  Concussion precautions given - keep patient in a quiet, not simulating, dark environment. No TV, computer use, video games until headache is resolved completely. No contact sports until cleared by the primary care provider or pediatrician. 3. Follow Up: With primary care physician in 2-3 days if headache persists.  Return to the emergency department if patient becomes lethargic, begins vomiting , develops double vision, speech difficulty, problems walking or other change in mental status.     Further ED Instructions:   Please call and follow-up within the concussion clinic as well as your primary care provider within the next 3 to 5 days.  In the meantime we would like you to avoid strenuous/over exertional activities such as sports or running.  Please avoid excess screen time utilizing cell phones, computers, or the TV.  Please avoid activities that require significant amount of concentration.  Please try to rest as much as possible.  Please take Tylenol and/or Motrin per over-the-counter dosing instructions for any continued discomfort.  Return to the ER for new or worsening symptoms or any other concerns that you may have.

## 2021-06-30 NOTE — ED Provider Notes (Signed)
[facility] EMERGENCY DEPARTMENT Provider Note   CSN: [id number] Arrival date & time: 06/30/21  1907     History Chief Complaint  Patient presents with   Head Injury    Craig Baker is a 25 y.o. male with noncontributory past medical history.  HPI Patient presents to emergency department today with chief complaint of head injury happening around 4 PM this afternoon.  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  He denies falling to the ground or any loss of consciousness.  He states an hour later he started headache and is friends recommended he get checked out.  He states his headache is mild.  Its a throbbing sensation.  Headache has worsened since onset.  He has not taken any medications prior to arrival.  He rates his headache currently 3 out of 10 in severity.  Patient also admits to pain when opening his mouth.  Denies any visual changes, neck pain, nausea, emesis, dizziness, lightheadedness, numbness, weakness or tingling.   Past Medical History:  Diagnosis Date   Allergy    Asthma    much improved.  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   Food allergy 10/28/2013   Elevated BP 10/26/2013   Depressed affect 10/26/2013   Well child check 05/11/2013   School failure 05/11/2013   Allergic rhinitis 10/01/2011   OSA (obstructive sleep apnea) 10/01/2011   Eczema    Asthma 06/11/2011   BMI (body mass index), pediatric,  95-99% for age 26/25/2012    Past Surgical History:  Procedure Laterality Date   ADENOIDECTOMY  2004   TONSILLECTOMY         Family History  Problem Relation Age of Onset   Asthma Mother    Asthma Maternal Grandmother    Diabetes Paternal Grandmother    Hypertension Paternal Grandmother    Alcohol abuse Neg Hx    Arthritis Neg Hx    Birth defects Neg Hx    Cancer Neg Hx    COPD Neg Hx    Depression Neg Hx    Drug abuse Neg Hx    Early death Neg Hx    Hearing loss Neg Hx    Heart disease Neg Hx    Hyperlipidemia Neg Hx    Kidney disease Neg Hx    Learning disabilities Neg Hx    Mental illness Neg Hx    Miscarriages / Stillbirths Neg Hx    Mental retardation Neg Hx    Stroke Neg Hx    Vision loss Neg Hx     Social History   Tobacco Use   Smoking status: Never   Smokeless tobacco: Never  Substance Use Topics   Alcohol use: No   Drug use: No    Home Medications Prior to Admission medications   Medication Sig Start Date End Date Taking? Authorizing Provider  albuterol (PROVENTIL) (2.5 MG/3ML) 0.083%  nebulizer solution Take 3 mLs (2.5 mg total) by nebulization every 6 (six) hours as needed for wheezing or shortness of breath. Patient not taking: Reported on 12/12/2020 09/26/20   Just, Azalee Course, FNP  albuterol (VENTOLIN HFA) 108 (90 Base) MCG/ACT inhaler Inhale 2 puffs into the lungs every 6 (six) hours as needed for wheezing or shortness of breath. Patient not taking: Reported on 12/12/2020 09/26/20   Just, Azalee Course, FNP  famotidine (PEPCID) 20 MG tablet Take 1 tablet (20 mg total) by mouth 2 (two) times daily. 09/26/20   Just, Azalee Course, FNP  loratadine (CLARITIN) 10 MG tablet Take 1 tablet (10 mg total) by mouth daily. 10/26/14   Preston Fleeting, MD  pantoprazole (PROTONIX) 40 MG tablet Take 1 tablet (40 mg total) by mouth daily. 12/12/20   Just, Azalee Course, FNP    Allergies    Fish allergy  Review of Systems   Review of Systems All other systems are reviewed and  are negative for acute change except as noted in the HPI.  Physical Exam Updated Vital Signs BP 130/77   Pulse 88   Temp 98.9 F (37.2 C)   Resp 17   SpO2 98%   Physical Exam Vitals and nursing note reviewed.  Constitutional:      Appearance: He is well-developed. He is not ill-appearing or toxic-appearing.  HENT:     Head: Normocephalic. No raccoon eyes or Battle's sign.     Jaw: There is normal jaw occlusion.     Comments: Mild tenderness to palpation of left temple.  No crepitus.  No palpable fracture or deformity.  No overlying skin changes.    Right Ear: Tympanic membrane and external ear normal. No mastoid tenderness. No hemotympanum.     Left Ear: Tympanic membrane and external ear normal. No mastoid tenderness. No hemotympanum.     Nose: Nose normal.     Right Nostril: No epistaxis or septal hematoma.     Left Nostril: No epistaxis or septal hematoma.     Mouth/Throat:     Mouth: No injury.     Dentition: No dental tenderness.  Eyes:     General: No scleral icterus.       Right eye: No discharge.        Left eye: No discharge.     Extraocular Movements: Extraocular movements intact.     Conjunctiva/sclera: Conjunctivae normal.     Pupils: Pupils are equal, round, and reactive to light.     Comments: No facial tenderness.  No pain with EOMs.  Neck:     Vascular: No JVD.     Comments: Full ROM intact without spinous process TTP. No bony stepoffs or deformities, no paraspinous muscle TTP or muscle spasms. No rigidity or meningeal signs. No bruising, erythema, or swelling.   Cardiovascular:     Rate and Rhythm: Normal rate and regular rhythm.     Pulses: Normal pulses.     Heart sounds: Normal heart sounds.  Pulmonary:     Effort: Pulmonary effort is normal.     Breath sounds: Normal breath sounds.  Abdominal:     General: There is no distension.  Musculoskeletal:        General: Normal range of motion.     Cervical back: Normal range of motion.  Skin:     General: Skin is warm and dry.  Neurological:     Mental Status: He is oriented to person, place, and time.     GCS:  GCS eye subscore is 4. GCS verbal subscore is 5. GCS motor subscore is 6.     Comments: Speech is clear and goal oriented, follows commands CN III-XII intact, no facial droop Normal strength in upper and lower extremities bilaterally including dorsiflexion and plantar flexion, strong and equal grip strength Sensation normal to light and sharp touch Moves extremities without ataxia, coordination intact Normal finger to nose and rapid alternating movements Normal gait and balance    Psychiatric:        Behavior: Behavior normal.    ED Results / Procedures / Treatments   Labs (all labs ordered are listed, but only abnormal results are displayed) Labs Reviewed - No data to display  EKG None  Radiology CT Head Wo Contrast  Result Date: 06/30/2021 CLINICAL DATA:  Status post trauma. EXAM: CT HEAD WITHOUT CONTRAST TECHNIQUE: Contiguous axial images were obtained from the base of the skull through the vertex without intravenous contrast. COMPARISON:  None. FINDINGS: Brain: No evidence of acute infarction, hemorrhage, hydrocephalus, extra-axial collection or mass lesion/mass effect. Cavum septum pellucidum and cavum vergae are identified. A 5 mm focal calcification is seen along the anteromedial aspect of the left cerebellum. Vascular: No hyperdense vessels are seen. Skull: Normal. Negative for fracture or focal lesion. Sinuses/Orbits: Very mild bilateral ethmoid sinus mucosal thickening is seen. Other: None. IMPRESSION: No acute intracranial abnormality. Electronically Signed   By: Aram Candela M.D.   On: 06/30/2021 19:46    Procedures Procedures   Medications Ordered in ED Medications - No data to display  ED Course  I have reviewed the triage vital signs and the nursing notes.  Pertinent labs & imaging results that were available during my care of the patient were  reviewed by me and considered in my medical decision making (see chart for details).    MDM Rules/Calculators/A&P                           History provided by patient with additional history obtained from chart review.    Patient with head injury which did not cause of loss of consciousness but with persistent headache since the initial trauma.  No evidence of skull fracture on physical exam. Patient is not taking anticoagulants, is less than 65 and has no history of subarachnoid or subdural hemorrhage. Patient denies nausea, vomiting, amnesia, vision changes,cognitive or memory dysfunction and vertigo.  Patient with no focal neurological deficits on physical exam.  Head CT is negative for acute findings. Discussed thoroughly symptoms to return to the emergency department including severe headaches, disequilibrium, vomiting, double vision, extremity weakness, difficulty ambulating, or any other concerning symptoms.  Discussed the likely etiology of patient's symptoms being concussive in nature.  Patient will be discharged with information pertaining to diagnosis and advised to use over-the-counter medications like NSAIDs and Tylenol for pain relief. Pt has also advised to not participate in contact sports until they are completely asymptomatic for at least 1 week or they are cleared by their doctor.    Portions of this note were generated with Scientist, clinical (histocompatibility and immunogenetics). Dictation errors may occur despite best attempts at proofreading.  Final Clinical Impression(s) / ED Diagnoses Final diagnoses:  Injury of head, initial encounter    Rx / DC Orders ED Discharge Orders     None        Kandice Hams 06/30/21 2049    Linwood Dibbles, MD 06/30/21 2216

## 2021-08-09 ENCOUNTER — Other Ambulatory Visit: Payer: Self-pay

## 2021-08-09 ENCOUNTER — Ambulatory Visit (INDEPENDENT_AMBULATORY_CARE_PROVIDER_SITE_OTHER): Payer: Managed Care, Other (non HMO) | Admitting: Sports Medicine

## 2021-08-09 VITALS — BP 140/100 | HR 89 | Ht 69.5 in | Wt 283.0 lb

## 2021-08-09 DIAGNOSIS — S060X0A Concussion without loss of consciousness, initial encounter: Secondary | ICD-10-CM

## 2021-08-09 NOTE — Progress Notes (Signed)
Aleen Sells [facility] [address] [place] [address] Phone: [phone]  Assessment and Plan:     1. Concussion without loss of consciousness, initial encounter -Acute, uncertain prognosis, initial sports medicine visit   Date of injury was 06/30/2021. Symptom severity scores of 5 and 8 today.  The patient was counseled on the nature of the injury, typical course and potential options for further evaluation and treatment. Discussed the importance of compliance with the below recommendations.   - Start light aerobic activity while keeping symptoms <3/10 as long as >48 hours from concussive event -  Eliminate screen time as much as possible for first 48 hours from concussive event, then continue limited screen time  - Work: Given work note that patient may take breaks as needed for 15 minutes   - Headache: OTC analgesics prn headache.  Start HEP for vestibular therapy.  Discussed starting amitriptyline, and elected to wait until next visit to discuss further - Vestibular symptoms: Recommended starting vestibular therapy.  Patient wishes to start HEP vestibular therapy and if not seeing improvement, will contact us and ask for vestibular therapy referral - Encouraged to RTC in 1-2 for reassessment or sooner for any concerns or acute changes  - Patient stated understanding of this plan and willingness to comply. All questions were answered.        Pertinent previous records reviewed include ER note x2, CT scan     Subjective:   I, Debbe Odea, am serving as a scribe for Dr. 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 Since the incident patient has been having mild headaches. Patient went to the ED had a CT scan that was normal and was  told he had a mild concussion and that if he started having headaches to see a concussion clinic. Patient did not have any headaches until about two weeks after the incident. Patient has noticed if he opens his jaw (yawning) the pain in the left temple is pretty bad and will trigger a headache.   Concussion HPI:  - Injury date: 06/30/2021   - Mechanism of injury: Kneed in the head playing soccer  - LOC: no  - Initial evaluation: 06/30/2021 ED  - Previous head injuries/concussions: no   - Previous imaging: yes     - Social history: Works around planes   Hospitalization for head injury? No Diagnosed/treated for headache disorder or migraines? No Diagnosed with learning disability Elnita Maxwell? No Diagnosed with ADD/ADHD? No Diagnose with Depression, anxiety, or other Psychiatric Disorder? No   Current medications:  Current Outpatient Medications  Medication Sig Dispense Refill   albuterol (PROVENTIL) (2.5 MG/3ML) 0.083% nebulizer solution Take 3 mLs (2.5 mg total) by nebulization every 6 (six) hours as needed for wheezing or shortness of breath. (Patient not taking: No sig reported) 75 mL 3   albuterol (VENTOLIN HFA) 108 (90 Base) MCG/ACT inhaler Inhale 2 puffs into the lungs every 6 (six) hours as needed for wheezing or shortness of breath. (Patient not taking: No sig reported) 18 g 3   famotidine (PEPCID) 20 MG tablet Take 1 tablet (20 mg total) by mouth 2 (two) times daily. (Patient not taking: Reported on 08/09/2021) 120 tablet 0   loratadine (CLARITIN) 10 MG tablet Take 1 tablet (10  mg total) by mouth daily. (Patient not taking: Reported on 08/09/2021) 30 tablet 12   pantoprazole (PROTONIX) 40 MG tablet Take 1 tablet (40 mg total) by mouth daily. (Patient not taking: Reported on 08/09/2021) 60 tablet 0   No current facility-administered medications for this visit.      Objective:     Vitals:   08/09/21 1121  BP: (!) 140/100  Pulse: 89  SpO2: 96%  Weight: 283 lb (128.4 kg)  Height: 5'  9.5" (1.765 m)      Body mass index is 41.19 kg/m.    Physical Exam:     General: Well-appearing, cooperative, sitting comfortably in no acute distress.  Psychiatric: Mood and affect are appropriate.     Today's Symptom Severity Score:  Scores: 0-6  Headache:3 "Pressure in head":1  Neck Pain:1  Nausea or vomiting:0  Dizziness:0  Blurred vision:0  Balance problems:0  Sensitivity to light:1  Sensitivity to noise:0  Feeling slowed down:0  Feeling like "in a fog":0  "Don't feel right":0  Difficulty concentrating:0  Difficulty remembering:0  Fatigue or low energy:0  Confusion:0  Drowsiness:2  More emotional:0  Irritability:0  Sadness:0  Nervous or Anxious:0  Trouble falling asleep:0   Total number of symptoms: 5/22  Symptom Severity index: 8/132  Worse with physical activity? yes Worse with mental activity? No Percent improved: 80 %    Full pain-free cervical PROM: yes    Tandem gait: - Forward, eyes open: 0 errors - Backward, eyes open: 1 errors - Forward, eyes closed: 3 errors - Backward, eyes closed: 5 errors  VOMS:   - Baseline symptoms: None - Smooth pursuits: Dizzy, 1/10  - Vertical Saccades: 0/10  - Horizontal Saccades: 0/10  - Vertical Vestibular-Ocular Reflex: 0/10  - Horizontal Vestibular-Ocular Reflex: 0/10  - Visual Motion Sensitivity Test: Dizzy, 2/10  - Convergence: 4,4cm (<5 cm normal)     Electronically signed by:  Aleen Sells [facility] 1:21 PM 08/09/21

## 2021-08-09 NOTE — Patient Instructions (Addendum)
   15 min breaks whenever needed work note Vestibular therapy at home Tylenol and IBU as needed for headaches Limit mental and physical activity to keep symptoms less than 3/10 Follow up in 1/2 weeks

## 2021-08-23 ENCOUNTER — Ambulatory Visit: Payer: Managed Care, Other (non HMO) | Admitting: Sports Medicine

## 2021-08-23 NOTE — Progress Notes (Deleted)
Aleen [facility]ells [facility] [address] [place] [address] Phone: [phone]  Assessment and Plan:     There are no diagnoses linked to this encounter.      Date of injury was 06/30/21. Symptom severity scores of *** and *** today. Original symptom severity scores were 5 and 8. The patient was counseled on the nature of the injury, typical course and potential options for further evaluation and treatment. Discussed the importance of compliance with the below recommendations.   - Start light aerobic activity while keeping symptoms <3/10 as long as >48 hours from concussive event -  Eliminate screen time as much as possible for first 48 hours from concussive event, then continue limited screen time  - SCHOOL: ***, Step down if return of symptoms when performing tasks that require attention/concentration  - SPORTS: ***, If symptoms with any of the above steps then rest and contact office    - Headache: *** OTC analgesics prn headache, encouraged not use then determine school/sports progression - Vestibular symptoms: ***With persistent vestibular symptoms consider vestibular rehab referral  - Neck pain: ***With persistent neck pain consider PT referral to eval and treat  - Insomnia: *** With persistent insomnia will prescribe Melatonin, TCA  - Nausea: ***With persistent nausea will prescribe Phenergan, Zofran  - Depression: ***With persistent psychologic or neuropsychologic symptoms consider referral to psychiatry or neuropsychology - With abnormal symptoms or persistence of symptoms consider MRI brain ***    - Encouraged to RTC in *** for reassessment or sooner for any concerns or acute changes  - Patient stated understanding of this plan and willingness to comply. All questions were answered.        Pertinent previous records reviewed include ***     Subjective:   I, Debbe Odea, am serving as a scribe for Dr. 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 Since the incident patient has been having mild headaches. Patient went to the ED had a CT scan that was normal and was told he had a mild concussion and that if he started having headaches to see a concussion clinic. Patient did not have any headaches until about two weeks after the incident. Patient has noticed if he opens his jaw (yawning) the pain in the left temple is pretty bad and will trigger a headache.  08/23/21 Patient states    Concussion HPI:  - Injury date: 06/30/2021   - Mechanism of injury: Kneed in the head playing soccer  - LOC: no  - Initial evaluation: 06/30/2021 ED  - Previous head injuries/concussions: no   - Previous imaging: yes     - Social history: Works around planes   Hospitalization for head injury? No Diagnosed/treated for headache disorder or migraines? No Diagnosed with learning disability Elnita Maxwell? No Diagnosed with ADD/ADHD? No Diagnose with Depression, anxiety, or other Psychiatric Disorder? No   Current medications:  Current Outpatient Medications  Medication Sig Dispense Refill   albuterol (PROVENTIL) (2.5 MG/3ML) 0.083% nebulizer solution Take 3 mLs (2.5 mg total) by nebulization every 6 (six) hours as needed for wheezing or shortness of breath. (Patient not taking: No sig reported) 75 mL 3   albuterol (VENTOLIN HFA) 108 (90 Base) MCG/ACT inhaler Inhale 2 puffs into the lungs every 6 (six) hours as needed  for wheezing or shortness of breath. (Patient not taking: No sig reported) 18 g 3   famotidine (PEPCID) 20 MG tablet Take 1 tablet (20 mg total) by mouth 2 (two) times daily. (Patient not taking: Reported on 08/09/2021) 120 tablet 0   loratadine (CLARITIN) 10 MG tablet Take 1 tablet (10 mg total) by mouth daily. (Patient not taking: Reported on 08/09/2021) 30 tablet  12   pantoprazole (PROTONIX) 40 MG tablet Take 1 tablet (40 mg total) by mouth daily. (Patient not taking: Reported on 08/09/2021) 60 tablet 0   No current facility-administered medications for this visit.      Objective:     There were no vitals filed for this visit.    There is no height or weight on file to calculate BMI.    Physical Exam:     General: Well-appearing, cooperative, sitting comfortably in no acute distress.  Psychiatric: Mood and affect are appropriate.     Today's Symptom Severity Score:  Scores: 0-6  Headache:*** "Pressure in head":***  Neck Pain:***  Nausea or vomiting:***  Dizziness:***  Blurred vision:***  Balance problems:***  Sensitivity to light:***  Sensitivity to noise:***  Feeling slowed down:***  Feeling like "in a fog":***  "Don't feel right":***  Difficulty concentrating:***  Difficulty remembering:***  Fatigue or low energy:***  Confusion:***  Drowsiness:***  More emotional:***  Irritability:***  Sadness:***  Nervous or Anxious:***  Trouble falling asleep:***   Total number of symptoms: ***/22  Symptom Severity index: ***/132  Worse with physical activity? No*** Worse with mental activity? No*** Percent improved: ***%    Full pain-free cervical PROM: yes***    Tandem gait: - Forward, eyes open: *** errors - Backward, eyes open: *** errors - Forward, eyes closed: *** errors - Backward, eyes closed: *** errors  VOMS:   - Baseline symptoms: ***, ***/10  - Smooth pursuits: ***/10  - Vertical Saccades: ***/10  - Horizontal Saccades:  ***/10  - Vertical Vestibular-Ocular Reflex: ***/10  - Horizontal Vestibular-Ocular Reflex: ***/10  - Visual Motion Sensitivity Test:  ***/10  - Convergence: ***cm (<5 cm normal)     Electronically signed by:  Aleen Sells [facility] 8:44 AM 08/23/21

## 2021-10-21 ENCOUNTER — Ambulatory Visit: Payer: Managed Care, Other (non HMO) | Admitting: Internal Medicine

## 2021-11-24 IMAGING — CT CT HEAD W/O CM
4 series · 16 of 47 positions shown, 18 images · non-contrast
Comparison: None.

CLINICAL DATA: Status post trauma.

EXAM:
CT HEAD WITHOUT CONTRAST
TECHNIQUE: Contiguous axial images were obtained from the base of the skull
through the vertex without intravenous contrast.

[Series 3: head bone · axial · 0.51mm/px · z∈[-240,-206]mm · 3 of 86 slices shown]
[im 9/86  bone]
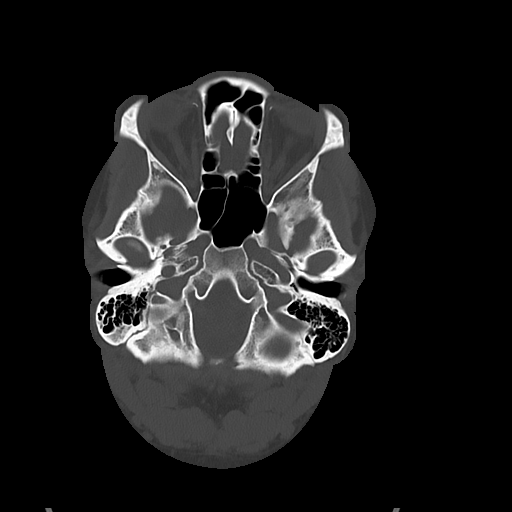
[im 18/86  bone]
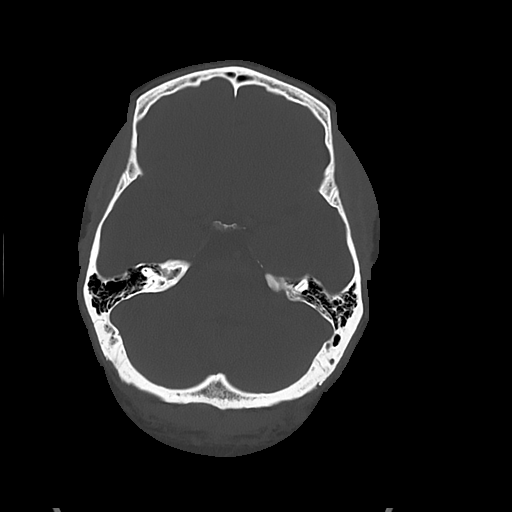
[im 26/86  bone]
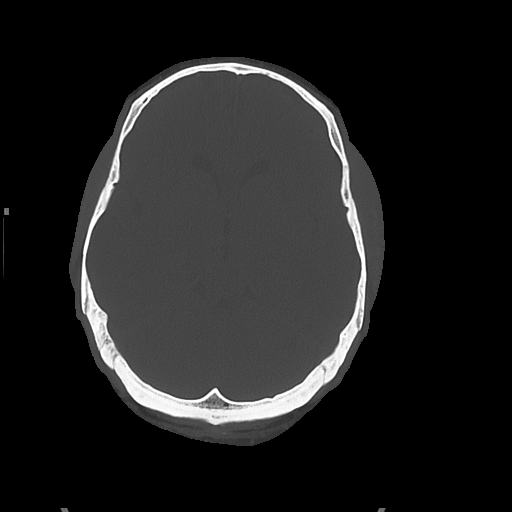

[Series 4: head wo · axial · 0.51mm/px · z∈[-236,-110]mm · 7 of 35 slices shown, 9 images]
[im 5/35  brain]
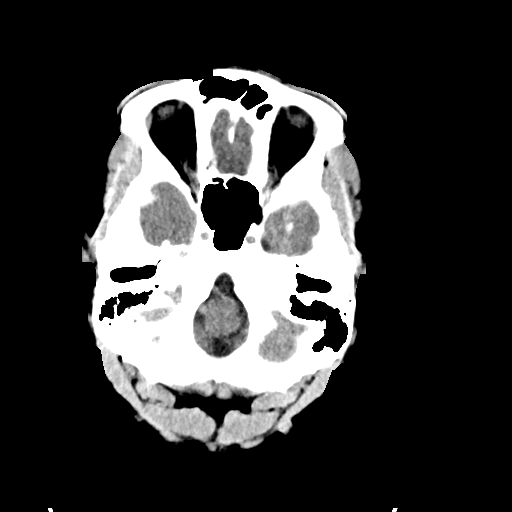
[im 5/35  bone]
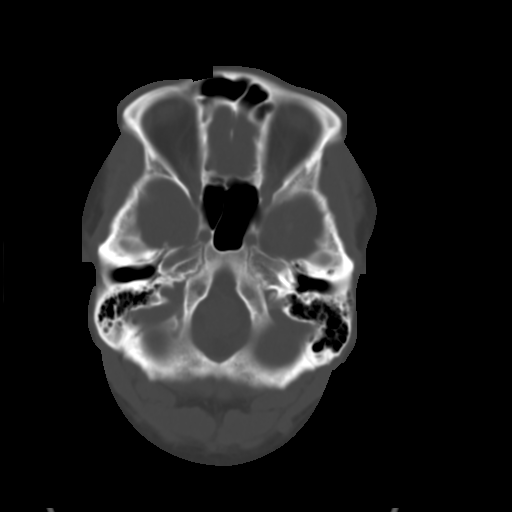
[im 9/35  brain]
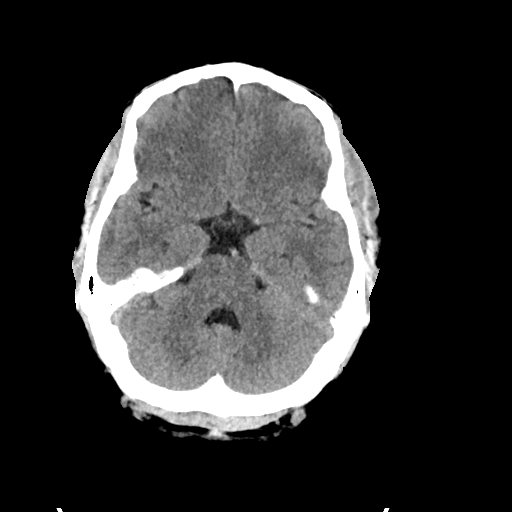
[im 13/35  brain]
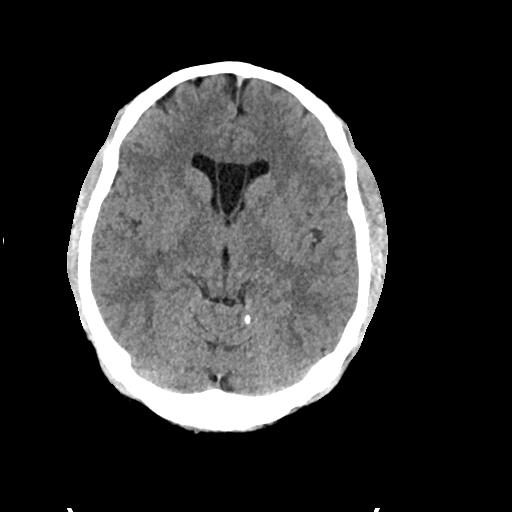
[im 18/35  brain]
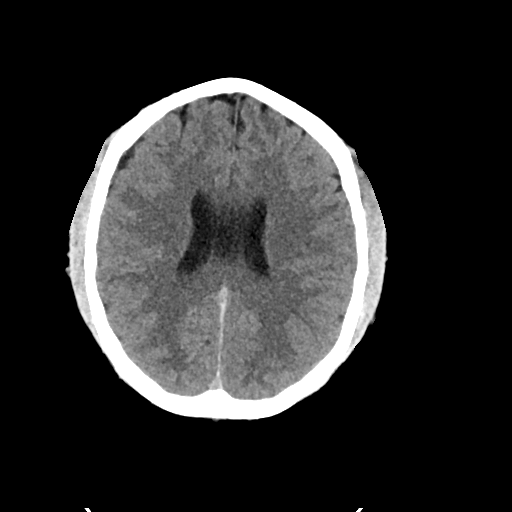
[im 22/35  brain]
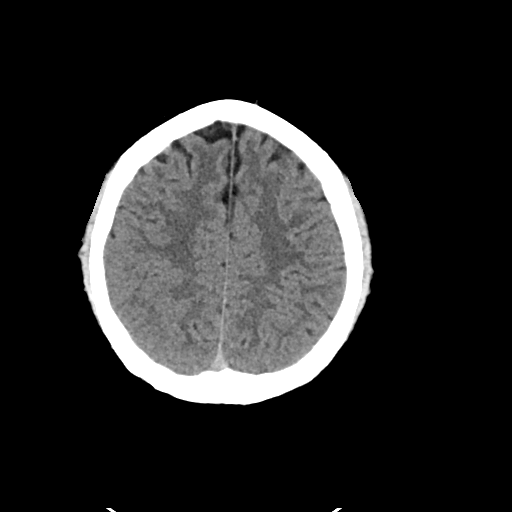
[im 22/35  bone]
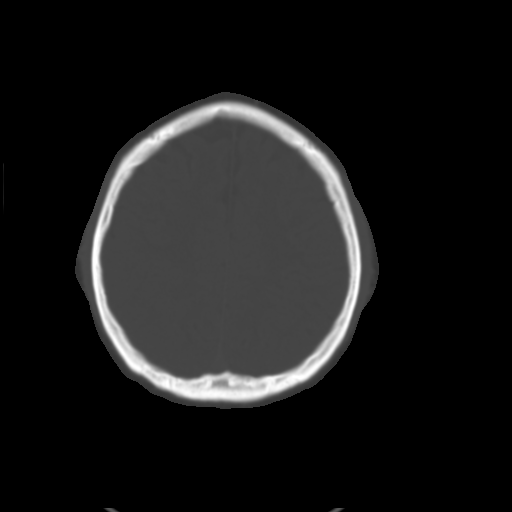
[im 26/35  brain]
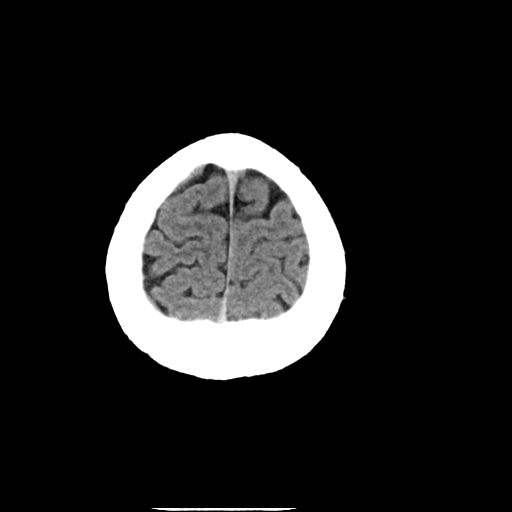
[im 30/35  brain]
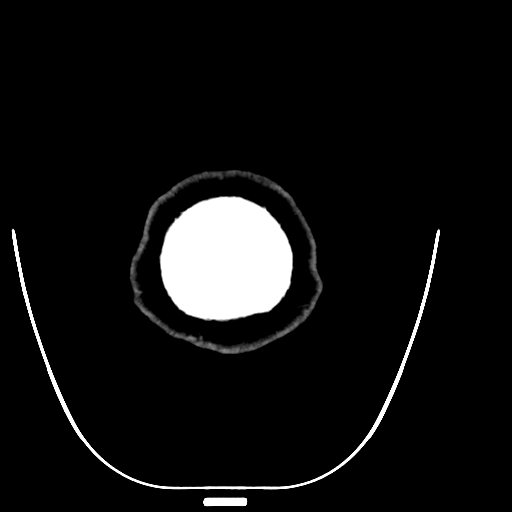

[Series 5: cor soft · coronal · 0.35mm/px · 3 of 72 slices shown]
[im 24/72  brain]
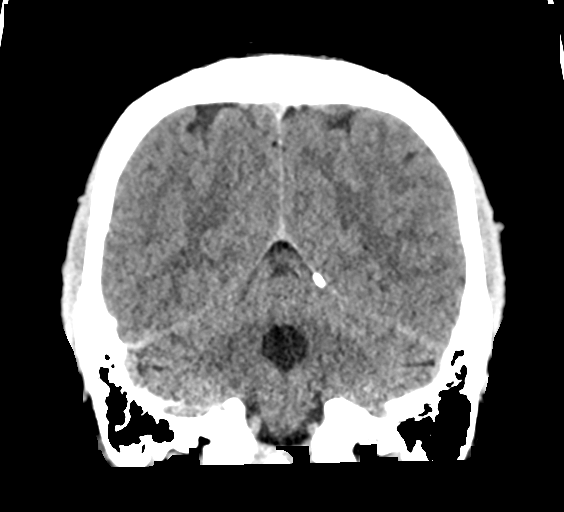
[im 32/72  brain]
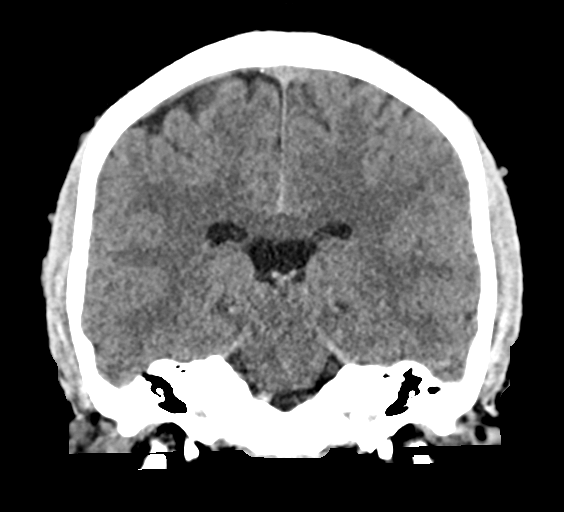
[im 40/72  brain]
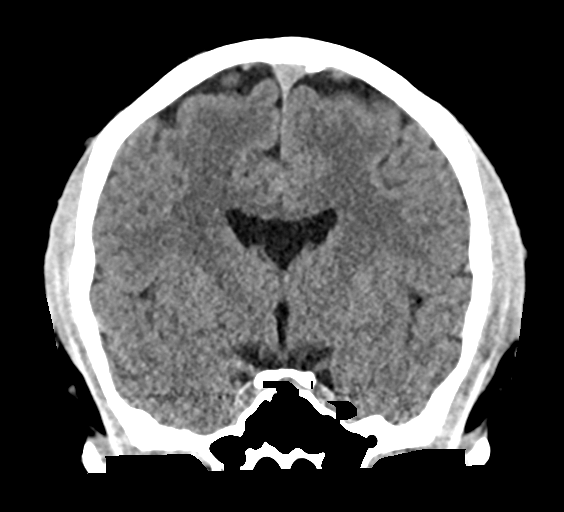

[Series 6: sag soft · sagittal · 0.33mm/px · 3 of 66 slices shown]
[im 22/66  brain]
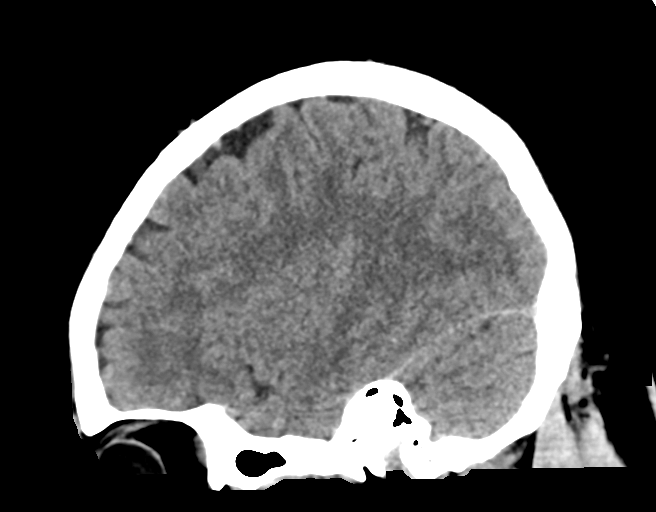
[im 33/66  brain]
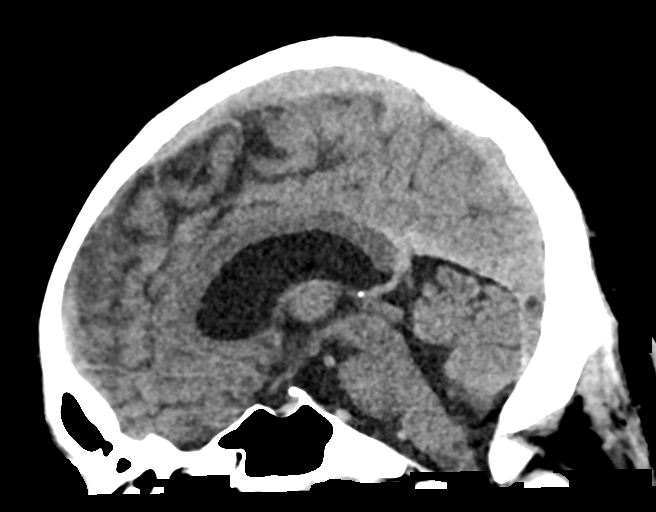
[im 44/66  brain]
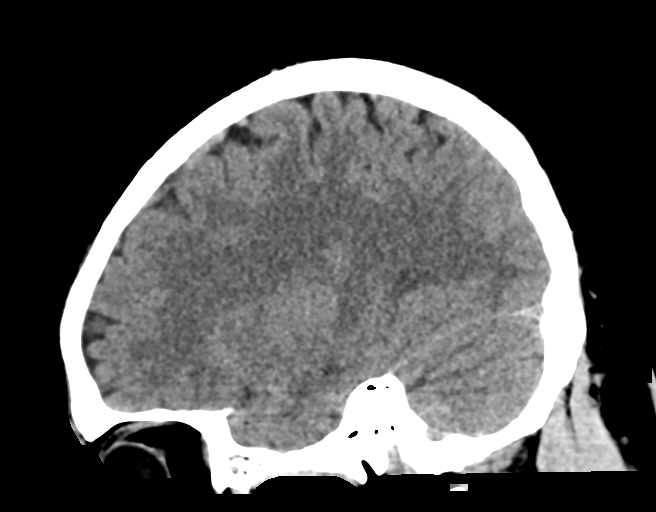

[16 of 47 positions shown; findings below may reference images not displayed]

FINDINGS: Brain: No evidence of acute infarction, hemorrhage, hydrocephalus,
extra-axial collection or mass lesion/mass effect.

Cavum septum pellucidum and cavum vergae are identified.

A 5 mm focal calcification is seen along the anteromedial aspect of
the left cerebellum.

Vascular: No hyperdense vessels are seen.

Skull: Normal. Negative for fracture or focal lesion.

Sinuses/Orbits: Very mild bilateral ethmoid sinus mucosal thickening
is seen.

Other: None.
IMPRESSION: No acute intracranial abnormality.
